# Patient Record
Sex: Female | Born: 2000 | Race: White | Hispanic: No | Marital: Single | Smoking: Never smoker
Health system: Southern US, Community
[De-identification: ages and names within clinical notes are randomized; demographics above are authoritative.]

## PROBLEM LIST (undated history)

## (undated) DIAGNOSIS — F32A Depression, unspecified: Secondary | ICD-10-CM

## (undated) DIAGNOSIS — K5090 Crohn's disease, unspecified, without complications: Secondary | ICD-10-CM

## (undated) DIAGNOSIS — L409 Psoriasis, unspecified: Secondary | ICD-10-CM

## (undated) DIAGNOSIS — O139 Gestational [pregnancy-induced] hypertension without significant proteinuria, unspecified trimester: Secondary | ICD-10-CM

## (undated) DIAGNOSIS — K5010 Crohn's disease of large intestine without complications: Secondary | ICD-10-CM

## (undated) DIAGNOSIS — F329 Major depressive disorder, single episode, unspecified: Secondary | ICD-10-CM

## (undated) HISTORY — DX: Crohn's disease of large intestine without complications: K50.10

## (undated) HISTORY — DX: Psoriasis, unspecified: L40.9

## (undated) HISTORY — PX: TYMPANOSTOMY TUBE PLACEMENT: SHX32

## (undated) HISTORY — PX: ADENOIDECTOMY: SUR15

## (undated) HISTORY — DX: Gestational (pregnancy-induced) hypertension without significant proteinuria, unspecified trimester: O13.9

## (undated) HISTORY — PX: CHOLECYSTECTOMY: SHX55

## (undated) HISTORY — PX: TONSILLECTOMY: SUR1361

---
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
## 2020-07-23 ENCOUNTER — Other Ambulatory Visit (HOSPITAL_COMMUNITY)
Admission: RE | Admit: 2020-07-23 | Discharge: 2020-07-23 | Disposition: A | Discharge status: Home / Self Care | Payer: 59 | Source: Ambulatory Visit | Attending: Obstetrics & Gynecology | Admitting: Obstetrics & Gynecology

## 2020-07-23 ENCOUNTER — Ambulatory Visit (INDEPENDENT_AMBULATORY_CARE_PROVIDER_SITE_OTHER): Payer: 59 | Admitting: Obstetrics & Gynecology

## 2020-07-23 ENCOUNTER — Other Ambulatory Visit: Payer: Self-pay

## 2020-07-23 ENCOUNTER — Encounter: Payer: Self-pay | Admitting: Obstetrics & Gynecology

## 2020-07-23 VITALS — BP 123/81 | HR 86 | Wt 234.0 lb

## 2020-07-23 DIAGNOSIS — Z3401 Encounter for supervision of normal first pregnancy, first trimester: Secondary | ICD-10-CM | POA: Diagnosis not present

## 2020-07-23 DIAGNOSIS — K5090 Crohn's disease, unspecified, without complications: Secondary | ICD-10-CM | POA: Insufficient documentation

## 2020-07-23 DIAGNOSIS — K5000 Crohn's disease of small intestine without complications: Secondary | ICD-10-CM | POA: Diagnosis not present

## 2020-07-23 DIAGNOSIS — Z3400 Encounter for supervision of normal first pregnancy, unspecified trimester: Secondary | ICD-10-CM | POA: Diagnosis present

## 2020-07-23 LAB — OB RESULTS CONSOLE GC/CHLAMYDIA: Gonorrhea: NEGATIVE

## 2020-07-23 MED ORDER — SERTRALINE HCL 25 MG PO TABS: ORAL_TABLET | ORAL | 0 refills | Status: DC

## 2020-07-23 NOTE — Addendum Note (Signed)
Addended by: Lesly Dukes on: 07/23/2020 03:03 PM   Modules accepted: Orders

## 2020-07-23 NOTE — Progress Notes (Signed)
ATING AND VIABILITY SONOGRAM   Alisson Rozell is a 20 y.o. year old G1P0 with LMP Patient's last menstrual period was 04/09/2020. which would correlate to  [redacted]w[redacted]d weeks gestation.  She has irregular menstrual cycles.   She is here today for a confirmatory initial sonogram.    GESTATION: SINGLETON Yes     FETAL ACTIVITY:          Heart rate         160          The fetus is active.          ADNEXA: The ovaries are normal.   GESTATIONAL AGE AND  BIOMETRICS:  Gestational criteria: Estimated Date of Delivery: 02/18/21 by early ultrasound now at [redacted]w[redacted]d  Previous Scans:0      CROWN RUMP LENGTH           30.40 mm         10 weeks                                                                               AVERAGE EGA(BY THIS SCAN):  10 weeks  WORKING EDD( date of conception, early ultrasound ):  02/18/21     TECHNICIAN COMMENTS:  Normal appearing single IUP with FHT of 160 BPM   A copy of this report including all images has been saved and backed up to a second source for retrieval if needed. All measures and details of the anatomical scan, placentation, fluid volume and pelvic anatomy are contained in that report.  Granville Lewis 07/23/2020 9:26 AM

## 2020-07-23 NOTE — Progress Notes (Signed)
PHQ: Score 4 GAD: Score 1

## 2020-07-23 NOTE — Progress Notes (Addendum)
Subjective:    Melissa Conway is a G1P0 [redacted]w[redacted]d being seen today for her first obstetrical visit.  Her obstetrical history is significant for obesity. Patient does intend to breast feed.  Patient conceived while taking birth control pills.  She had not missed any pills prior to several days testing positive.  She has Crohn's disease which is controlled well on Humira.  Patient also has depression and takes Wellbutrin.  Patient reports nausea.  Vitals:   07/23/20 0856  BP: 123/81  Pulse: 86  Weight: 234 lb (106.1 kg)    HISTORY: OB History  Gravida Para Term Preterm AB Living  1            SAB IAB Ectopic Multiple Live Births               # Outcome Date GA Lbr Len/2nd Weight Sex Delivery Anes PTL Lv  1 Current            Past Medical History:  Diagnosis Date   Crohn's colitis (HCC)    Depression    Past Surgical History:  Procedure Laterality Date   ADENOIDECTOMY     CHOLECYSTECTOMY     TONSILLECTOMY     TYMPANOSTOMY TUBE PLACEMENT     Family History  Problem Relation Age of Onset   Diabetes Mother    Depression Mother    OCD Mother    Depression Father    Heart disease Maternal Aunt    Cancer Maternal Uncle    Cancer Maternal Grandmother    Depression Maternal Grandmother    Anxiety disorder Maternal Grandmother    Cancer Maternal Grandfather    Diabetes Maternal Grandfather    Diabetes Paternal Grandfather      Exam    Uterus:     Pelvic Exam:    Perineum: No Hemorrhoids   Vulva: normal   Vagina:  normal mucosa, normal discharge   pH: N/a   Cervix: no cervical motion tenderness and no lesions   Adnexa: no mass, fullness, tenderness   Bony Pelvis: average  System: Breast:  normal appearance, no masses or tenderness   Skin: normal coloration and turgor, no rashes    Neurologic: oriented, normal mood   Extremities: no deformities   HEENT sclera clear, anicteric, neck supple with midline trachea, and thyroid without masses   Mouth/Teeth mucous  membranes moist, pharynx normal without lesions   Neck supple and no masses   Cardiovascular: regular rate and rhythm   Respiratory:  appears well, vitals normal, no respiratory distress, acyanotic, normal RR, chest clear, no wheezing, crepitations, rhonchi, normal symmetric air entry   Abdomen: soft, non-tender; bowel sounds normal; no masses,  no organomegaly   Urinary: urethral meatus normal      Assessment:    Pregnancy: G1P0 Patient Active Problem List   Diagnosis Date Noted   Supervision of low-risk first pregnancy 07/23/2020   Gluteal cleft wound 08/30/2015        Plan:     Initial labs drawn. Prenatal vitamins. Problem list reviewed and updated. Genetic Screening discussed:  Pts wants NIPS and AFP and Horizon  Ultrasound discussed; fetal survey: requested.    Crohn's Disease--continue [employer] (pharmacy consult with Delores.  Risk of immunosuppresion in 3rd trimester and infant can be immunosuppressed first 1-3 months of life.    Discussed transitioning from Wellbutrin to Zoloft.  We will wean her Wellbutrin to 75 mg daily for 5 days then off while starting  NIPS at 12 weeks and AFP at  15 weeks. Baby scripts schedule optimization   Melissa Conway 07/23/2020

## 2020-07-24 NOTE — Addendum Note (Signed)
Addended by: Granville Lewis on: 07/24/2020 09:42 AM   Modules accepted: Orders

## 2020-07-25 LAB — TSH: TSH: 1.8 mIU/L

## 2020-07-25 LAB — OBSTETRIC PANEL
Absolute Monocytes: 408 cells/uL (ref 200–950)
Antibody Screen: NOT DETECTED
Basophils Absolute: 32 cells/uL (ref 0–200)
Basophils Relative: 0.4 %
Eosinophils Absolute: 32 cells/uL (ref 15–500)
Eosinophils Relative: 0.4 %
HCT: 38.9 % (ref 35.0–45.0)
Hemoglobin: 13.1 g/dL (ref 11.7–15.5)
Hepatitis B Surface Ag: NONREACTIVE
Lymphs Abs: 1696 cells/uL (ref 850–3900)
MCH: 29.6 pg (ref 27.0–33.0)
MCHC: 33.7 g/dL (ref 32.0–36.0)
MCV: 87.8 fL (ref 80.0–100.0)
MPV: 12.4 fL (ref 7.5–12.5)
Monocytes Relative: 5.1 %
Neutro Abs: 5832 cells/uL (ref 1500–7800)
Neutrophils Relative %: 72.9 %
Platelets: 254 10*3/uL (ref 140–400)
RBC: 4.43 10*6/uL (ref 3.80–5.10)
RDW: 14.5 % (ref 11.0–15.0)
RPR Ser Ql: NONREACTIVE
Rubella: 3.87 Index
Total Lymphocyte: 21.2 %
WBC: 8 10*3/uL (ref 3.8–10.8)

## 2020-07-25 LAB — HEMOGLOBINOPATHY EVALUATION
Fetal Hemoglobin Testing: <1 % (ref 0.0–1.9)
HCT: 39.6 % (ref 35.0–45.0)
Hemoglobin A2 - HGBRFX: 2.5 % (ref 2.2–3.2)
Hemoglobin: 13.3 g/dL (ref 11.7–15.5)
Hgb A: 97.5 % (ref >96.0)
MCH: 29.6 pg (ref 27.0–33.0)
MCV: 88.2 fL (ref 80.0–100.0)
RBC: 4.49 10*6/uL (ref 3.80–5.10)
RDW: 14.7 % (ref 11.0–15.0)

## 2020-07-25 LAB — URINE CULTURE
MICRO NUMBER:: 12104131
SPECIMEN QUALITY:: ADEQUATE

## 2020-07-25 LAB — HEPATITIS C ANTIBODY
Hepatitis C Ab: NONREACTIVE
SIGNAL TO CUT-OFF: 0.09 (ref <1.00)

## 2020-07-25 LAB — HIV ANTIBODY (ROUTINE TESTING W REFLEX): HIV 1&2 Ab, 4th Generation: NONREACTIVE

## 2020-07-27 LAB — CYTOLOGY - PAP
Chlamydia: NEGATIVE
Comment: NEGATIVE
Comment: NORMAL
Diagnosis: NEGATIVE
Neisseria Gonorrhea: NEGATIVE

## 2020-08-06 ENCOUNTER — Other Ambulatory Visit: Payer: Self-pay

## 2020-08-06 ENCOUNTER — Ambulatory Visit (INDEPENDENT_AMBULATORY_CARE_PROVIDER_SITE_OTHER): Payer: 59 | Admitting: *Deleted

## 2020-08-06 DIAGNOSIS — Z3490 Encounter for supervision of normal pregnancy, unspecified, unspecified trimester: Secondary | ICD-10-CM

## 2020-08-06 DIAGNOSIS — Z36 Encounter for antenatal screening for chromosomal anomalies: Secondary | ICD-10-CM

## 2020-08-06 NOTE — Progress Notes (Signed)
Pt here for NIPTS only. °

## 2020-08-20 ENCOUNTER — Encounter: Payer: Self-pay | Admitting: *Deleted

## 2020-08-20 DIAGNOSIS — Z3400 Encounter for supervision of normal first pregnancy, unspecified trimester: Secondary | ICD-10-CM

## 2020-08-20 NOTE — Progress Notes (Signed)
Horizons scanned into chart and routed to Dr Penne Lash.

## 2020-08-21 ENCOUNTER — Encounter: Payer: Self-pay | Admitting: *Deleted

## 2020-08-21 DIAGNOSIS — Z3400 Encounter for supervision of normal first pregnancy, unspecified trimester: Secondary | ICD-10-CM

## 2020-08-30 ENCOUNTER — Encounter: Payer: 59 | Admitting: Obstetrics and Gynecology

## 2020-09-06 ENCOUNTER — Ambulatory Visit (INDEPENDENT_AMBULATORY_CARE_PROVIDER_SITE_OTHER): Payer: 59 | Admitting: Obstetrics and Gynecology

## 2020-09-06 ENCOUNTER — Other Ambulatory Visit: Payer: Self-pay

## 2020-09-06 ENCOUNTER — Encounter: Payer: Self-pay | Admitting: Obstetrics and Gynecology

## 2020-09-06 VITALS — BP 133/80 | HR 81 | Wt 230.0 lb

## 2020-09-06 DIAGNOSIS — Z3400 Encounter for supervision of normal first pregnancy, unspecified trimester: Secondary | ICD-10-CM

## 2020-09-06 DIAGNOSIS — L409 Psoriasis, unspecified: Secondary | ICD-10-CM | POA: Insufficient documentation

## 2020-09-06 DIAGNOSIS — F32A Depression, unspecified: Secondary | ICD-10-CM | POA: Insufficient documentation

## 2020-09-06 DIAGNOSIS — F419 Anxiety disorder, unspecified: Secondary | ICD-10-CM

## 2020-09-06 DIAGNOSIS — K5000 Crohn's disease of small intestine without complications: Secondary | ICD-10-CM

## 2020-09-06 NOTE — Progress Notes (Signed)
   PRENATAL VISIT NOTE  Subjective:  Melissa Conway is a 20 y.o. G1P0 at [redacted]w[redacted]d being seen today for ongoing prenatal care.  She is currently monitored for the following issues for this low-risk pregnancy and has Supervision of low-risk first pregnancy; Crohn disease (HCC); Psoriasis; and Anxiety and depression on their problem list.  Patient reports no complaints.  Contractions: Not present. Vag. Bleeding: None.  Movement: Absent. Denies leaking of fluid.   The following portions of the patient's history were reviewed and updated as appropriate: allergies, current medications, past family history, past medical history, past social history, past surgical history and problem list.   Objective:   Vitals:   09/06/20 1022  BP: 133/80  Pulse: 81  Weight: 230 lb (104.3 kg)    Fetal Status: Fetal Heart Rate (bpm): 143   Movement: Absent     General:  Alert, oriented and cooperative. Patient is in no acute distress.  Skin: Skin is warm and dry. No rash noted.   Cardiovascular: Normal heart rate noted  Respiratory: Normal respiratory effort, no problems with respiration noted  Abdomen: Soft, gravid, appropriate for gestational age.  Pain/Pressure: Absent     Pelvic: Cervical exam deferred        Extremities: Normal range of motion.  Edema: None  Mental Status: Normal mood and affect. Normal behavior. Normal judgment and thought content.   Assessment and Plan:  Pregnancy: G1P0 at [redacted]w[redacted]d 1. Encounter for supervision of low-risk first pregnancy, antepartum - She may be interested in water birth. Discussed would need classes and meet with CNMs. Will have her do next appt with them.  - Horizon and NIPS negative - She is on babyscripts  - Alpha fetoprotein, maternal - [place] MFM OB COMP + 14 WK; Future  2. Anxiety and depression - Well controlled at this time on Zoloft   3. Crohn's disease of small intestine without complication (HCC) - In remission on Humaira  Preterm labor symptoms and  general obstetric precautions including but not limited to vaginal bleeding, contractions, leaking of fluid and fetal movement were reviewed in detail with the patient. Please refer to After Visit Summary for other counseling recommendations.   Return in about 4 weeks (around 10/04/2020), or with CNM please.  No future appointments.  Milas Hock, MD

## 2020-09-07 LAB — ALPHA FETOPROTEIN, MATERNAL
AFP MoM: 1.06
AFP, Serum: 29 ng/mL
Calc'd Gestational Age: 16.4 weeks
Maternal Wt: 230 [lb_av]
Risk for ONTD: <1
Twins-AFP: 1

## 2020-09-27 ENCOUNTER — Ambulatory Visit: Payer: 59 | Attending: Obstetrics and Gynecology

## 2020-09-27 ENCOUNTER — Other Ambulatory Visit: Payer: Self-pay | Admitting: Obstetrics and Gynecology

## 2020-09-27 ENCOUNTER — Other Ambulatory Visit: Payer: Self-pay

## 2020-09-27 ENCOUNTER — Other Ambulatory Visit: Payer: Self-pay | Admitting: *Deleted

## 2020-09-27 DIAGNOSIS — O99212 Obesity complicating pregnancy, second trimester: Secondary | ICD-10-CM | POA: Diagnosis not present

## 2020-09-27 DIAGNOSIS — Z3689 Encounter for other specified antenatal screening: Secondary | ICD-10-CM

## 2020-09-27 DIAGNOSIS — Z3400 Encounter for supervision of normal first pregnancy, unspecified trimester: Secondary | ICD-10-CM | POA: Insufficient documentation

## 2020-09-27 DIAGNOSIS — E669 Obesity, unspecified: Secondary | ICD-10-CM

## 2020-09-27 DIAGNOSIS — Z362 Encounter for other antenatal screening follow-up: Secondary | ICD-10-CM

## 2020-09-27 DIAGNOSIS — Z3A19 19 weeks gestation of pregnancy: Secondary | ICD-10-CM

## 2020-10-02 ENCOUNTER — Other Ambulatory Visit: Payer: Self-pay

## 2020-10-02 ENCOUNTER — Ambulatory Visit (INDEPENDENT_AMBULATORY_CARE_PROVIDER_SITE_OTHER): Payer: 59

## 2020-10-02 VITALS — BP 124/75 | HR 85 | Wt 236.0 lb

## 2020-10-02 DIAGNOSIS — Z3400 Encounter for supervision of normal first pregnancy, unspecified trimester: Secondary | ICD-10-CM

## 2020-10-02 DIAGNOSIS — Z23 Encounter for immunization: Secondary | ICD-10-CM | POA: Diagnosis not present

## 2020-10-02 DIAGNOSIS — Z3A20 20 weeks gestation of pregnancy: Secondary | ICD-10-CM

## 2020-10-02 DIAGNOSIS — K5900 Constipation, unspecified: Secondary | ICD-10-CM

## 2020-10-02 DIAGNOSIS — O99612 Diseases of the digestive system complicating pregnancy, second trimester: Secondary | ICD-10-CM

## 2020-10-02 NOTE — Progress Notes (Signed)
   PRENATAL VISIT NOTE  Subjective:  Melissa Conway is a 20 y.o. G1P0 at [redacted]w[redacted]d being seen today for ongoing prenatal care.  She is currently monitored for the following issues for this low-risk pregnancy and has Supervision of low-risk first pregnancy; Crohn disease (HCC); Psoriasis; and Anxiety and depression on their problem list.  Patient reports  constipation . She reports issues with constipation throughout her life due to Crohn's but feels like it has been significantly worse in pregnancy. She reports using an enema once a week currently.  Contractions: Not present. Vag. Bleeding: None.  Movement: Present. Denies leaking of fluid.   The following portions of the patient's history were reviewed and updated as appropriate: allergies, current medications, past family history, past medical history, past social history, past surgical history and problem list.   Objective:   Vitals:   10/02/20 0904  BP: 124/75  Pulse: 85  Weight: 236 lb (107 kg)    Fetal Status: Fetal Heart Rate (bpm): 149 Fundal Height: 20 cm Movement: Present     General:  Alert, oriented and cooperative. Patient is in no acute distress.  Skin: Skin is warm and dry. No rash noted.   Cardiovascular: Normal heart rate noted  Respiratory: Normal respiratory effort, no problems with respiration noted  Abdomen: Soft, gravid, appropriate for gestational age.  Pain/Pressure: Absent     Pelvic: Cervical exam deferred        Extremities: Normal range of motion.  Edema: None  Mental Status: Normal mood and affect. Normal behavior. Normal judgment and thought content.   Assessment and Plan:  Pregnancy: G1P0 at [redacted]w[redacted]d 1. Encounter for supervision of low-risk first pregnancy, antepartum -Constipation relief options reviewed. List of safe medications and poop clean out reviewed.  -Patient is scheduled to see GI on 10/6 to discuss management as well  -Pregnancy without issues so far.  -Desires waterbirth. Discussed  requirements and things that would risk her out. Discussed that waterbirth can be an option when CNM is on labor and delivery but cannot always guarantee due to nature of C[facility]  2. [redacted] weeks gestation of pregnancy -Incomplete anatomy, scheduled for follow up in 4 weeks on 10/13  Preterm labor symptoms and general obstetric precautions including but not limited to vaginal bleeding, contractions, leaking of fluid and fetal movement were reviewed in detail with the patient. Please refer to After Visit Summary for other counseling recommendations.   Return in about 4 weeks (around 10/30/2020) for Return OB visit.  Future Appointments  Date Time Provider Department Center  10/25/2020  3:30 PM [facility] NURSE [facility]  10/25/2020  3:45 PM [facility]  11/02/2020  8:10 AM Donette Larry, CNM [facility]    Rolm Bookbinder, CNM 10/02/20 9:44 AM

## 2020-10-02 NOTE — Patient Instructions (Signed)
Safe Medications in Pregnancy   Acne: Benzoyl Peroxide Salicylic Acid  Backache/Headache: Tylenol: 2 regular strength every 4 hours OR              2 Extra strength every 6 hours  Colds/Coughs/Allergies: Benadryl (alcohol free) 25 mg every 6 hours as needed Breath right strips Claritin Cepacol throat lozenges Chloraseptic throat spray Cold-Eeze- up to three times per day Cough drops, alcohol free Flonase (by prescription only) Guaifenesin Mucinex Robitussin DM (plain only, alcohol free) Saline nasal spray/drops Sudafed (pseudoephedrine) & Actifed ** use only after [redacted] weeks gestation and if you do not have high blood pressure Tylenol Vicks Vaporub Zinc lozenges Zyrtec   Constipation: Colace Ducolax suppositories Fleet enema Glycerin suppositories Metamucil Milk of magnesia Miralax Senokot Smooth move tea  Diarrhea: Kaopectate Imodium A-D  *NO pepto Bismol  Hemorrhoids: Anusol Anusol HC Preparation H Tucks  Indigestion: Tums Maalox Mylanta Zantac  Pepcid  Insomnia: Benadryl (alcohol free) 25mg every 6 hours as needed Tylenol PM Unisom, no Gelcaps  Leg Cramps: Tums MagGel  Nausea/Vomiting:  Bonine Dramamine Emetrol Ginger extract Sea bands Meclizine  Nausea medication to take during pregnancy:  Unisom (doxylamine succinate 25 mg tablets) Take one tablet daily at bedtime. If symptoms are not adequately controlled, the dose can be increased to a maximum recommended dose of two tablets daily (1/2 tablet in the morning, 1/2 tablet mid-afternoon and one at bedtime). Vitamin B6 100mg tablets. Take one tablet twice a day (up to 200 mg per day).  Skin Rashes: Aveeno products Benadryl cream or 25mg every 6 hours as needed Calamine Lotion 1% cortisone cream  Yeast infection: Gyne-lotrimin 7 Monistat 7   **If taking multiple medications, please check labels to avoid duplicating the same active ingredients **take medication as directed on  the label ** Do not exceed 4000 mg of tylenol in 24 hours **Do not take medications that contain aspirin or ibuprofen    Constipation/Bowel Prep  You have constipation which is hard stools that are difficult to pass. It is important to have regular bowel movements every 1-3 days that are soft and easy to pass. Hard stools increase your risk of hemorrhoids and are very uncomfortable.   To prevent constipation you can increase the amount of fiber in your diet. Examples of foods with fiber are leafy greens, whole grain breads, oatmeal and other grains.  It is also important to drink at least eight 8oz glass of water everyday.   If you have not has a bowel movement in 4-5 days you made need to clean out your bowel.  This will have establish normal movement through your bowel.    Miralax Clean out  Take 8 capfuls of miralax in 64 oz of gatorade. You can use any fluid that appeals to you (gatorade, water, juice)  Continue to drink at least eight 8 oz glasses of water throughout the day  You can repeat with another 8 capfuls of miralax in 64 oz of gatorade if you are not having a large amount of stools  You will need to be at home and close to a bathroom for about 8 hours when you do the above as you may need to go to the bathroom frequently.   After you are cleaned out: - Start Colace100mg twice daily - Start Miralax once daily - Start a daily fiber supplement like metamucil or citrucel - You can safely use enemas in pregnancy  - if you are having diarrhea you can reduce to Colace once   a day or miralax every other day or a 1/2 capful daily.    

## 2020-10-02 NOTE — Progress Notes (Signed)
Pt c/o constipation- sees GI doc on 10/6

## 2020-10-25 ENCOUNTER — Ambulatory Visit: Payer: 59 | Attending: Obstetrics

## 2020-10-25 ENCOUNTER — Ambulatory Visit: Payer: 59 | Admitting: *Deleted

## 2020-10-25 ENCOUNTER — Other Ambulatory Visit: Payer: Self-pay

## 2020-10-25 ENCOUNTER — Encounter: Payer: Self-pay | Admitting: *Deleted

## 2020-10-25 VITALS — BP 130/68 | HR 93

## 2020-10-25 DIAGNOSIS — Z3A23 23 weeks gestation of pregnancy: Secondary | ICD-10-CM

## 2020-10-25 DIAGNOSIS — Z362 Encounter for other antenatal screening follow-up: Secondary | ICD-10-CM | POA: Diagnosis not present

## 2020-10-25 DIAGNOSIS — Z3400 Encounter for supervision of normal first pregnancy, unspecified trimester: Secondary | ICD-10-CM | POA: Insufficient documentation

## 2020-10-25 DIAGNOSIS — E669 Obesity, unspecified: Secondary | ICD-10-CM | POA: Diagnosis not present

## 2020-10-25 DIAGNOSIS — O99212 Obesity complicating pregnancy, second trimester: Secondary | ICD-10-CM

## 2020-10-25 DIAGNOSIS — O99612 Diseases of the digestive system complicating pregnancy, second trimester: Secondary | ICD-10-CM

## 2020-10-26 ENCOUNTER — Other Ambulatory Visit: Payer: Self-pay | Admitting: *Deleted

## 2020-10-26 DIAGNOSIS — Z6837 Body mass index (BMI) 37.0-37.9, adult: Secondary | ICD-10-CM

## 2020-10-26 DIAGNOSIS — K5090 Crohn's disease, unspecified, without complications: Secondary | ICD-10-CM

## 2020-11-02 ENCOUNTER — Other Ambulatory Visit: Payer: Self-pay

## 2020-11-02 ENCOUNTER — Ambulatory Visit (INDEPENDENT_AMBULATORY_CARE_PROVIDER_SITE_OTHER): Payer: 59 | Admitting: Certified Nurse Midwife

## 2020-11-02 VITALS — BP 129/83 | HR 89 | Wt 244.0 lb

## 2020-11-02 DIAGNOSIS — Z3402 Encounter for supervision of normal first pregnancy, second trimester: Secondary | ICD-10-CM

## 2020-11-02 DIAGNOSIS — Z3A24 24 weeks gestation of pregnancy: Secondary | ICD-10-CM

## 2020-11-02 DIAGNOSIS — O99210 Obesity complicating pregnancy, unspecified trimester: Secondary | ICD-10-CM | POA: Insufficient documentation

## 2020-11-02 NOTE — Progress Notes (Addendum)
Subjective:  Loretto Belinsky is a 20 y.o. G1P0 at [redacted]w[redacted]d being seen today for ongoing prenatal care.  She is currently monitored for the following issues for this low-risk pregnancy and has Supervision of low-risk first pregnancy; Crohn disease (HCC); Psoriasis; Anxiety and depression; and Obesity in pregnancy on their problem list.  Patient reports no complaints.  Contractions: Not present. Vag. Bleeding: None.  Movement: Present. Denies leaking of fluid.   The following portions of the patient's history were reviewed and updated as appropriate: allergies, current medications, past family history, past medical history, past social history, past surgical history and problem list. Problem list updated.  Objective:   Vitals:   11/02/20 0811  BP: 129/83  Pulse: 89  Weight: 244 lb (110.7 kg)    Fetal Status: Fetal Heart Rate (bpm): 138 Fundal Height: 27 cm Movement: Present     General:  Alert, oriented and cooperative. Patient is in no acute distress.  Skin: Skin is warm and dry. No rash noted.   Cardiovascular: Normal heart rate noted  Respiratory: Normal respiratory effort, no problems with respiration noted  Abdomen: Soft, gravid, appropriate for gestational age. Pain/Pressure: Absent     Pelvic: Vag. Bleeding: None Vag D/C Character: Thin   Cervical exam deferred        Extremities: Normal range of motion.  Edema: None  Mental Status: Normal mood and affect. Normal behavior. Normal judgment and thought content.   Urinalysis:      Assessment and Plan:  Pregnancy: G1P0 at [redacted]w[redacted]d  1. Encounter for supervision of low-risk first pregnancy in second trimester - considering waterbirth: recommend class and remaining visits with CNM  2. Obesity in pregnancy - nml growth, [place] scheduled next month  3. [redacted] weeks gestation  Preterm labor symptoms and general obstetric precautions including but not limited to vaginal bleeding, contractions, leaking of fluid and fetal movement were reviewed in  detail with the patient. Please refer to After Visit Summary for other counseling recommendations.  Return in about 4 weeks (around 11/30/2020).   Donette Larry, CNM

## 2020-11-22 ENCOUNTER — Ambulatory Visit: Payer: 59 | Admitting: *Deleted

## 2020-11-22 ENCOUNTER — Encounter: Payer: Self-pay | Admitting: *Deleted

## 2020-11-22 ENCOUNTER — Other Ambulatory Visit: Payer: Self-pay | Admitting: *Deleted

## 2020-11-22 ENCOUNTER — Other Ambulatory Visit: Payer: Self-pay

## 2020-11-22 ENCOUNTER — Ambulatory Visit: Payer: 59 | Attending: Obstetrics

## 2020-11-22 VITALS — BP 126/71 | HR 81

## 2020-11-22 DIAGNOSIS — E669 Obesity, unspecified: Secondary | ICD-10-CM

## 2020-11-22 DIAGNOSIS — O99212 Obesity complicating pregnancy, second trimester: Secondary | ICD-10-CM

## 2020-11-22 DIAGNOSIS — Z3A27 27 weeks gestation of pregnancy: Secondary | ICD-10-CM

## 2020-11-22 DIAGNOSIS — Z3402 Encounter for supervision of normal first pregnancy, second trimester: Secondary | ICD-10-CM | POA: Diagnosis present

## 2020-11-22 DIAGNOSIS — O99210 Obesity complicating pregnancy, unspecified trimester: Secondary | ICD-10-CM | POA: Insufficient documentation

## 2020-11-22 DIAGNOSIS — K5090 Crohn's disease, unspecified, without complications: Secondary | ICD-10-CM

## 2020-11-22 DIAGNOSIS — Z6837 Body mass index (BMI) 37.0-37.9, adult: Secondary | ICD-10-CM | POA: Insufficient documentation

## 2020-11-22 DIAGNOSIS — O99612 Diseases of the digestive system complicating pregnancy, second trimester: Secondary | ICD-10-CM

## 2020-11-30 ENCOUNTER — Ambulatory Visit (INDEPENDENT_AMBULATORY_CARE_PROVIDER_SITE_OTHER): Payer: 59 | Admitting: Certified Nurse Midwife

## 2020-11-30 ENCOUNTER — Other Ambulatory Visit: Payer: Self-pay

## 2020-11-30 VITALS — BP 132/76 | HR 96 | Wt 248.0 lb

## 2020-11-30 DIAGNOSIS — Z23 Encounter for immunization: Secondary | ICD-10-CM

## 2020-11-30 DIAGNOSIS — Z3402 Encounter for supervision of normal first pregnancy, second trimester: Secondary | ICD-10-CM | POA: Diagnosis not present

## 2020-11-30 DIAGNOSIS — F419 Anxiety disorder, unspecified: Secondary | ICD-10-CM

## 2020-11-30 DIAGNOSIS — O99210 Obesity complicating pregnancy, unspecified trimester: Secondary | ICD-10-CM

## 2020-11-30 DIAGNOSIS — Z3A28 28 weeks gestation of pregnancy: Secondary | ICD-10-CM

## 2020-11-30 DIAGNOSIS — F32A Depression, unspecified: Secondary | ICD-10-CM

## 2020-11-30 NOTE — Progress Notes (Signed)
Subjective:  Melissa Conway is a 20 y.o. G1P0 at [redacted]w[redacted]d being seen today for ongoing prenatal care.  She is currently monitored for the following issues for this low-risk pregnancy and has Supervision of low-risk first pregnancy; Crohn disease (HCC); Psoriasis; Anxiety and depression; and Obesity in pregnancy on their problem list.  Patient reports no complaints.  Contractions: Not present. Vag. Bleeding: None.  Movement: Present. Denies leaking of fluid.   The following portions of the patient's history were reviewed and updated as appropriate: allergies, current medications, past family history, past medical history, past social history, past surgical history and problem list. Problem list updated.  Objective:   Vitals:   11/30/20 0828  BP: 132/76  Pulse: 96  Weight: 248 lb (112.5 kg)    Fetal Status: Fetal Heart Rate (bpm): 131 Fundal Height: 29 cm Movement: Present     General:  Alert, oriented and cooperative. Patient is in no acute distress.  Skin: Skin is warm and dry. No rash noted.   Cardiovascular: Normal heart rate noted  Respiratory: Normal respiratory effort, no problems with respiration noted  Abdomen: Soft, gravid, appropriate for gestational age. Pain/Pressure: Absent     Pelvic: Vag. Bleeding: None Vag D/C Character: Thin   Cervical exam deferred        Extremities: Normal range of motion.  Edema: None  Mental Status: Normal mood and affect. Normal behavior. Normal judgment and thought content.   Urinalysis:      Assessment and Plan:  Pregnancy: G1P0 at [redacted]w[redacted]d  1. Encounter for supervision of low-risk first pregnancy in second trimester  - 2Hr GTT w/ 1 Hr Carpenter 75 g - HIV antibody (with reflex) - CBC - RPR - Tdap vaccine greater than or equal to 7yo IM  2. Obesity in pregnancy - 59 %ile on last scan, f/u US for growth  3. Anxiety and depression -stable on Zoloft  4. [redacted] weeks gestation of pregnancy - GTT today  Preterm labor symptoms and general  obstetric precautions including but not limited to vaginal bleeding, contractions, leaking of fluid and fetal movement were reviewed in detail with the patient. Please refer to After Visit Summary for other counseling recommendations.  Return in about 2 weeks (around 12/14/2020).   Donette Larry, CNM

## 2020-12-02 ENCOUNTER — Encounter: Payer: Self-pay | Admitting: Certified Nurse Midwife

## 2020-12-02 DIAGNOSIS — O99019 Anemia complicating pregnancy, unspecified trimester: Secondary | ICD-10-CM | POA: Insufficient documentation

## 2020-12-03 LAB — CBC
HCT: 31.4 % — ABNORMAL LOW (ref 35.0–45.0)
Hemoglobin: 10.4 g/dL — ABNORMAL LOW (ref 11.7–15.5)
MCH: 31.1 pg (ref 27.0–33.0)
MCHC: 33.1 g/dL (ref 32.0–36.0)
MCV: 94 fL (ref 80.0–100.0)
MPV: 12.1 fL (ref 7.5–12.5)
Platelets: 198 10*3/uL (ref 140–400)
RBC: 3.34 10*6/uL — ABNORMAL LOW (ref 3.80–5.10)
RDW: 12.8 % (ref 11.0–15.0)
WBC: 8.5 10*3/uL (ref 3.8–10.8)

## 2020-12-03 LAB — 2HR GTT W 1 HR, CARPENTER, 75 G
Glucose, 1 Hr, Gest: 148 mg/dL (ref 65–179)
Glucose, 2 Hr, Gest: 116 mg/dL (ref 65–152)
Glucose, Fasting, Gest: 84 mg/dL (ref 65–91)

## 2020-12-03 LAB — RPR: RPR Ser Ql: NONREACTIVE

## 2020-12-03 LAB — HIV ANTIBODY (ROUTINE TESTING W REFLEX): HIV 1&2 Ab, 4th Generation: NONREACTIVE

## 2020-12-14 ENCOUNTER — Telehealth (INDEPENDENT_AMBULATORY_CARE_PROVIDER_SITE_OTHER): Payer: 59 | Admitting: Certified Nurse Midwife

## 2020-12-14 VITALS — BP 113/75 | HR 95 | Wt 247.0 lb

## 2020-12-14 DIAGNOSIS — Z3A30 30 weeks gestation of pregnancy: Secondary | ICD-10-CM

## 2020-12-14 DIAGNOSIS — O99213 Obesity complicating pregnancy, third trimester: Secondary | ICD-10-CM

## 2020-12-14 DIAGNOSIS — Z3402 Encounter for supervision of normal first pregnancy, second trimester: Secondary | ICD-10-CM

## 2020-12-14 DIAGNOSIS — E669 Obesity, unspecified: Secondary | ICD-10-CM

## 2020-12-14 DIAGNOSIS — O99210 Obesity complicating pregnancy, unspecified trimester: Secondary | ICD-10-CM

## 2020-12-14 DIAGNOSIS — D649 Anemia, unspecified: Secondary | ICD-10-CM

## 2020-12-14 DIAGNOSIS — O99013 Anemia complicating pregnancy, third trimester: Secondary | ICD-10-CM

## 2020-12-14 NOTE — Progress Notes (Signed)
[facility]  OBSTETRICS PRENATAL VIRTUAL VISIT ENCOUNTER NOTE  Provider location: [facility] [place]   Patient location: Home  I connected with Melissa Conway on 12/14/20 at  8:10 AM EST by MyChart Video Encounter and verified that I am speaking with the correct person using two identifiers. I discussed the limitations, risks, security and privacy concerns of performing an evaluation and management service virtually and the availability of in person appointments. I also discussed with the patient that there may be a patient responsible charge related to this service. The patient expressed understanding and agreed to proceed. Subjective:  Melissa Conway is a 20 y.o. G1P0 at [redacted]w[redacted]d being seen today for ongoing prenatal care.  She is currently monitored for the following issues for this low-risk pregnancy and has Supervision of low-risk first pregnancy; Crohn disease ([place]); Psoriasis; Anxiety and depression; Obesity in pregnancy; and Anemia in pregnancy on their problem list.  Patient reports no complaints.  Contractions: Not present. Vag. Bleeding: None.  Movement: Present. Denies any leaking of fluid.   The following portions of the patient's history were reviewed and updated as appropriate: allergies, current medications, past family history, past medical history, past social history, past surgical history and problem list.   Objective:   Vitals:   12/14/20 0814  BP: 113/75  Pulse: 95  Weight: 247 lb (112 kg)    Fetal Status:     Movement: Present     General:  Alert, oriented and cooperative. Patient is in no acute distress.  Respiratory: Normal respiratory effort, no problems with respiration noted  Mental Status: Normal mood and affect. Normal behavior. Normal judgment and thought content.  Rest of physical exam deferred due to type of encounter  Imaging: [place] MFM OB FOLLOW UP  Result Date:  11/22/2020 ----------------------------------------------------------------------  OBSTETRICS REPORT                       (Signed Final 11/22/2020 10:31 am) ---------------------------------------------------------------------- Patient Info  ID #:       [id number]                          D.O.B.:  2000/02/27 (20 yrs)  Name:       Melissa Conway                 Visit Date: 11/22/2020 07:31 am ---------------------------------------------------------------------- Performed By  Attending:        Tama High MD        Referred By:      Radene Gunning  Performed By:     Lelan Pons RDMS       [facility]                                                             [place] [facility] ---------------------------------------------------------------------- Orders  #  Description                           Code        Ordered By  1  [place] MFM OB FOLLOW UP                   [phone]    Peterson Ao ----------------------------------------------------------------------  #  Order #                     Accession #                Episode #  1  [id number]                   [id number]                 [id number] ---------------------------------------------------------------------- Indications  [redacted] weeks gestation of pregnancy                Z3A.27  Maternal Crohn's disease affecting             O99.612  pregnancy in second trimester  Obesity complicating pregnancy, second         O99.212  trimester ---------------------------------------------------------------------- Fetal Evaluation  Num Of Fetuses:         1  Fetal Heart Rate(bpm):  130  Cardiac Activity:       Observed  Presentation:           Cephalic  Placenta:               Posterior  P. Cord Insertion:      Previously Visualized  Amniotic Fluid  AFI FV:      Within normal limits                              Largest Pocket(cm)                               5.1 ---------------------------------------------------------------------- Biometry  BPD:        71  mm     G. Age:  28w 4d         74  %    CI:         72.6   %    70 - 86                                                          FL/HC:      20.0   %    18.6 - 20.4  HC:       265   mm     G. Age:  28w 6d         67  %    HC/AC:      1.14        1.05 - 1.21  AC:      231.8  mm     G. Age:  27w 4d         45  %    FL/BPD:     74.8   %    71 -  87  FL:       53.1  mm     G. Age:  28w 1d         59  %    FL/AC:      22.9   %    20 - 24  LV:        4.3  mm  Est. FW:    1151  gm      2 lb 9 oz     59  % ---------------------------------------------------------------------- Gestational Age  LMP:           32w 3d        Date:  04/09/20                 EDD:   01/14/21  U/S Today:     28w 2d                                        EDD:   02/12/21  Best:          27w 3d     Det. ByMarcella Dubs         EDD:   02/18/21                                      (07/23/20) ---------------------------------------------------------------------- Anatomy  Cranium:               Appears normal         Aortic Arch:            Previously seen  Cavum:                 Previously seen        Ductal Arch:            Previously seen  Ventricles:            Appears normal         Diaphragm:              Previously seen  Choroid Plexus:        Previously seen        Stomach:                Appears normal, left                                                                        sided  Cerebellum:            Previously seen        Abdomen:                Previously seen  Posterior Fossa:       Previously seen        Abdominal Wall:         Previously seen  Nuchal Fold:           Previously seen        Cord Vessels:           Previously seen  Face:                  Orbits and profile     Kidneys:                Appear normal                         previously seen  Lips:                  Previously seen        Bladder:                 Appears normal  Thoracic:              Previously seen        Spine:                  Appears normal  Heart:                 Appears normal         Upper Extremities:      Previously seen                         (4CH, axis, and                         situs)  RVOT:                  Previously seen        Lower Extremities:      Previously seen  LVOT:                  Previously seen  Other:  Female gender previously seen. VC appears normal, 3VV and 3VTV          previously visualized. ---------------------------------------------------------------------- Cervix Uterus Adnexa  Cervix  Not visualized (advanced GA >24wks)  Right Ovary  Visualized.  Left Ovary  Visualized. ---------------------------------------------------------------------- Impression  Maternal Crohn's disease.  Patient takes Humira every 2  weeks.  She has not had flares in this pregnancy.  Fetal growth is appropriate for gestational age .Amniotic fluid  is normal and good fetal activity is seen .  We reassured the patient of the findings. ---------------------------------------------------------------------- Recommendations  -An appointment was made for her to return in 5 weeks for  fetal growth assessment. ----------------------------------------------------------------------                  Tama High, MD Electronically Signed Final Report   11/22/2020 10:31 am ----------------------------------------------------------------------   Assessment and Plan:  Pregnancy: G1P0 at [redacted]w[redacted]d 1. Obesity in pregnancy - growth [place] in 2 weeks  2. Encounter for supervision of low-risk first pregnancy in third trimester  3. Anemia during pregnancy in third trimester - started Fe, tolerating well - encouraged high Fe foods  4. [redacted] weeks gestation of pregnancy   Preterm labor symptoms and general obstetric precautions including but not limited to vaginal bleeding, contractions, leaking of fluid and fetal movement were reviewed in detail  with the patient. I discussed the assessment and treatment plan with the patient. The patient was provided an opportunity to ask questions and all were answered. The patient agreed with the plan and demonstrated an understanding of the instructions. The patient was advised to call back or seek an in-person office evaluation/go to MAU at [place] for any  urgent or concerning symptoms. Please refer to After Visit Summary for other counseling recommendations.   I provided 5 minutes of face-to-face time during this encounter.  Return in about 2 weeks (around 12/28/2020).  Future Appointments  Date Time Provider [place]  12/27/2020  7:45 AM [facility] NURSE [facility]  12/27/2020  8:00 AM [facility] US1 [facility] [place], [place] for [employer], [place]

## 2020-12-15 ENCOUNTER — Encounter (HOSPITAL_BASED_OUTPATIENT_CLINIC_OR_DEPARTMENT_OTHER): Payer: Self-pay

## 2020-12-15 ENCOUNTER — Emergency Department (HOSPITAL_BASED_OUTPATIENT_CLINIC_OR_DEPARTMENT_OTHER): Payer: 59 | Admitting: Radiology

## 2020-12-15 ENCOUNTER — Emergency Department (HOSPITAL_BASED_OUTPATIENT_CLINIC_OR_DEPARTMENT_OTHER)
Admission: EM | Admit: 2020-12-15 | Discharge: 2020-12-15 | Disposition: A | Discharge status: Home / Self Care | Payer: 59 | Attending: Emergency Medicine | Admitting: Emergency Medicine

## 2020-12-15 ENCOUNTER — Other Ambulatory Visit: Payer: Self-pay

## 2020-12-15 DIAGNOSIS — Z20822 Contact with and (suspected) exposure to covid-19: Secondary | ICD-10-CM | POA: Diagnosis not present

## 2020-12-15 DIAGNOSIS — R233 Spontaneous ecchymoses: Secondary | ICD-10-CM | POA: Diagnosis not present

## 2020-12-15 DIAGNOSIS — O26893 Other specified pregnancy related conditions, third trimester: Secondary | ICD-10-CM | POA: Diagnosis present

## 2020-12-15 DIAGNOSIS — O98513 Other viral diseases complicating pregnancy, third trimester: Secondary | ICD-10-CM | POA: Insufficient documentation

## 2020-12-15 DIAGNOSIS — Z3A30 30 weeks gestation of pregnancy: Secondary | ICD-10-CM

## 2020-12-15 DIAGNOSIS — R000 Tachycardia, unspecified: Secondary | ICD-10-CM | POA: Insufficient documentation

## 2020-12-15 DIAGNOSIS — J101 Influenza due to other identified influenza virus with other respiratory manifestations: Secondary | ICD-10-CM

## 2020-12-15 LAB — CBC
HCT: 30.4 % — ABNORMAL LOW (ref 36.0–46.0)
Hemoglobin: 10.3 g/dL — ABNORMAL LOW (ref 12.0–15.0)
MCH: 31.3 pg (ref 26.0–34.0)
MCHC: 33.9 g/dL (ref 30.0–36.0)
MCV: 92.4 fL (ref 80.0–100.0)
Platelets: 182 10*3/uL (ref 150–400)
RBC: 3.29 MIL/uL — ABNORMAL LOW (ref 3.87–5.11)
RDW: 12.7 % (ref 11.5–15.5)
WBC: 7 10*3/uL (ref 4.0–10.5)
nRBC: 0 % (ref 0.0–0.2)

## 2020-12-15 LAB — BASIC METABOLIC PANEL
Anion gap: 10 (ref 5–15)
BUN: 6 mg/dL (ref 6–20)
CO2: 23 mmol/L (ref 22–32)
Calcium: 8.9 mg/dL (ref 8.9–10.3)
Chloride: 102 mmol/L (ref 98–111)
Creatinine, Ser: 0.48 mg/dL (ref 0.44–1.00)
GFR, Estimated: >60 mL/min (ref >60)
Glucose, Bld: 93 mg/dL (ref 70–99)
Potassium: 3.8 mmol/L (ref 3.5–5.1)
Sodium: 135 mmol/L (ref 135–145)

## 2020-12-15 LAB — RESP PANEL BY RT-PCR (FLU A&B, COVID) ARPGX2
Influenza A by PCR: POSITIVE — AB
Influenza B by PCR: NEGATIVE
SARS Coronavirus 2 by RT PCR: NEGATIVE

## 2020-12-15 MED ORDER — OSELTAMIVIR PHOSPHATE 75 MG PO CAPS: 75.0000 mg | ORAL_CAPSULE | Freq: Two times a day (BID) | ORAL | 0 refills | Status: DC

## 2020-12-15 MED ORDER — ONDANSETRON 4 MG PO TBDP: 4.0000 mg | ORAL_TABLET | Freq: Once | ORAL | Status: DC

## 2020-12-15 MED ORDER — SODIUM CHLORIDE 0.9 % IV BOLUS
1000.0000 mL | Freq: Once | INTRAVENOUS | Status: AC
  Administered 2020-12-15: 1000 mL via INTRAVENOUS

## 2020-12-15 NOTE — ED Provider Notes (Signed)
[facility] EMERGENCY DEPT Provider Note   CSN: [id number] Arrival date & time: 12/15/20  1214     History Chief Complaint  Patient presents with   Cough    Melissa Conway is a 20 y.o. female.  HPI 20 year old female G1, P0 at [redacted] weeks gestation, history of Crohn's on Humira presents today with cough and petechia.  Reports that she coughed last night.  She states it was fairly severe.  She had some headache and nasal congestion.  She has been vaccinated for COVID and flu.  Today she went to the urgent care because of some chest pain with the coughing.  They noted petechiae on her face.  They sent her to the ED for further evaluation.     Past Medical History:  Diagnosis Date   Crohn's colitis [facility])    Depression     Patient Active Problem List   Diagnosis Date Noted   Anemia in pregnancy 12/02/2020   Obesity in pregnancy 11/02/2020   Psoriasis 09/06/2020   Anxiety and depression 09/06/2020   Supervision of low-risk first pregnancy 07/23/2020   Crohn disease ([place]) 07/23/2020    Past Surgical History:  Procedure Laterality Date   ADENOIDECTOMY     CHOLECYSTECTOMY     TONSILLECTOMY     TYMPANOSTOMY TUBE PLACEMENT       OB History     Gravida  1   Para      Term      Preterm      AB      Living         SAB      IAB      Ectopic      Multiple      Live Births              Family History  Problem Relation Age of Onset   Diabetes Mother    Depression Mother    OCD Mother    Depression Father    Melanoma Maternal Grandmother    Depression Maternal Grandmother    Anxiety disorder Maternal Grandmother    Colon cancer Maternal Grandfather    Diabetes Maternal Grandfather    Diabetes Paternal Grandfather    Heart disease Maternal Aunt    Non-Hodgkin's lymphoma Maternal Uncle     Social History   Tobacco Use   Smoking status: Never   Smokeless tobacco: Never  Vaping Use   Vaping Use: Never used  Substance Use Topics    Alcohol use: No   Drug use: No    Home Medications Prior to Admission medications   Medication Sig Start Date End Date Taking? Authorizing Provider  oseltamivir (TAMIFLU) 75 MG capsule Take 1 capsule (75 mg total) by mouth every 12 (twelve) hours. 12/15/20  Yes Pattricia Boss, MD  HUMIRA PEN 40 MG/0.4ML PNKT SMARTSIG:40 Milligram(s) SUB-Q Every 2 Weeks 07/02/20   [provider]  Multiple Vitamin (MULTIVITAMIN) tablet Take 1 tablet by mouth daily.    [provider]  polyethylene glycol (MIRALAX / GLYCOLAX) 17 g packet Take 17 g by mouth daily.    [provider]  sertraline (ZOLOFT) 25 MG tablet Take 1 tablet daily for 5 days then increase to 2 tablets daily. 07/23/20   Guss Bunde, MD    Allergies    Vancomycin, Penicillin g, and Penicillins  Review of Systems   Review of Systems  Physical Exam Updated Vital Signs BP 126/78   Pulse (!) 113   Temp 98.3  F (36.8 C)   Resp (!) 22   Ht 1.676 m (5\' 6" )   Wt 112 kg   LMP 04/09/2020   SpO2 98%   BMI 39.87 kg/m   Physical Exam Vitals reviewed.  Constitutional:      Appearance: Normal appearance.  HENT:     Head: Normocephalic.     Right Ear: External ear normal.     Left Ear: External ear normal.     Nose: Nose normal.     Mouth/Throat:     Mouth: Mucous membranes are moist.  Eyes:     Extraocular Movements: Extraocular movements intact.     Pupils: Pupils are equal, round, and reactive to light.  Cardiovascular:     Rate and Rhythm: Regular rhythm. Tachycardia present.  Pulmonary:     Effort: Pulmonary effort is normal.     Breath sounds: Normal breath sounds.  Abdominal:     Palpations: Abdomen is soft.     Comments: Abdomen is gravid consistent with dates  Musculoskeletal:        General: Normal range of motion.     Cervical back: Normal range of motion.  Skin:    General: Skin is warm and dry.     Capillary Refill: Capillary refill takes less than 2 seconds.  Neurological:      General: No focal deficit present.     Mental Status: She is alert.  Psychiatric:        Mood and Affect: Mood normal.        Behavior: Behavior normal.    ED Results / Procedures / Treatments   Labs (all labs ordered are listed, but only abnormal results are displayed) Labs Reviewed  RESP PANEL BY RT-PCR (FLU A&B, COVID) ARPGX2 - Abnormal; Notable for the following components:      Result Value   Influenza A by PCR POSITIVE (*)    All other components within normal limits  CBC - Abnormal; Notable for the following components:   RBC 3.29 (*)    Hemoglobin 10.3 (*)    HCT 30.4 (*)    All other components within normal limits  BASIC METABOLIC PANEL    EKG EKG Interpretation  Date/Time:  Saturday December 15 2020 13:36:13 EST Ventricular Rate:  113 PR Interval:  137 QRS Duration: 82 QT Interval:  329 QTC Calculation: 452 R Axis:   84 Text Interpretation: Sinus tachycardia Confirmed by Pattricia Boss [phone]) on 12/15/2020 2:49:08 PM  Radiology DG Chest 2 View  Result Date: 12/15/2020 CLINICAL DATA:  Cough. EXAM: CHEST - 2 VIEW COMPARISON:  None. FINDINGS: Cardiomediastinal silhouette is normal. Mediastinal contours appear intact. Elevation of the right hemidiaphragm. There is no evidence of focal airspace consolidation, pleural effusion or pneumothorax. Osseous structures are without acute abnormality. Soft tissues are grossly normal. IMPRESSION: No active cardiopulmonary disease. Elevation of the right hemidiaphragm, etiology uncertain. Electronically Signed   By: Fidela Salisbury M.D.   On: 12/15/2020 15:03    Procedures Procedures   Medications Ordered in ED Medications  sodium chloride 0.9 % bolus 1,000 mL (1,000 mLs Intravenous New Bag/Given 12/15/20 1503)    ED Course  I have reviewed the triage vital signs and the nursing notes.  Pertinent labs & imaging results that were available during my care of the patient were reviewed by me and considered in my medical  decision making (see chart for details).  Clinical Course as of 12/15/20 1517  Sat Dec 15, 2020  1447 Hemoglobin(!): 10.3 [DR]  1448 Hemoglobin is 10 stable from 2 weeks ago. She is not febrile here she has had her flu vaccine. Will give Tamiflu.  [DR]  1508 Discussed care with Dr. Macon Large.  She is aware of the episodes of hypertension.  The patient's blood pressure has normalized greatly without intervention.  He is receiving a liter of fluid.  She will be placed on Tamiflu.  She has a follow-up appointment on the 16th at [facility].  We have discussed that she should follow-up at [facility] and [facility] if she has any further problems.  She voices understanding. [DR]    Clinical Course User Index [DR] Margarita Grizzle, MD   MDM Rules/Calculators/A&P                          20 year old female [redacted] weeks gestation presents today with cough, petechiae likely from coughing, chest pain, and is flu positive.  She is mildly tachycardic here in the ED's some is this is consistent with her pregnancy.  Her initial blood pressure was elevated at 154/79.  Repeat is 138/80.  CBC is checked and normal.  I feel the petechia are secondary to the coughing.  Low index of suspicion for HELLP please get your Tamiflu filled and take it. Drink plenty of fluids Recheck at MAU if any further problems You have influenza A Please drink plenty of fluids Syndrome. Final Clinical Impression(s) / ED Diagnoses Final diagnoses:  Influenza A  [redacted] weeks gestation of pregnancy  Petechiae    Rx / DC Orders ED Discharge Orders          Ordered    oseltamivir (TAMIFLU) 75 MG capsule  Every 12 hours        12/15/20 1510             Margarita Grizzle, MD 12/15/20 1517

## 2020-12-15 NOTE — ED Notes (Signed)
Dc instructions reviewed with patient. Patient voiced understanding. Dc with belongings.  °

## 2020-12-15 NOTE — ED Notes (Signed)
Fetal heart tone obtained with doppler, strong heart rate 153.

## 2020-12-15 NOTE — ED Triage Notes (Signed)
Patient here POV from Home with Cough that began yesterday PM.  Patient was seen at [facility] for Same and was sent for "Blood Work" due to "Blood Splotches" on Face. Dry Cough. No Fevers at Home.  NAD Noted during Triage. A&Ox4. GCS 15. Ambulatory

## 2020-12-27 ENCOUNTER — Other Ambulatory Visit: Payer: Self-pay

## 2020-12-27 ENCOUNTER — Ambulatory Visit: Payer: 59 | Admitting: *Deleted

## 2020-12-27 ENCOUNTER — Ambulatory Visit: Payer: 59 | Attending: Obstetrics and Gynecology

## 2020-12-27 ENCOUNTER — Encounter: Payer: Self-pay | Admitting: *Deleted

## 2020-12-27 ENCOUNTER — Other Ambulatory Visit: Payer: Self-pay | Admitting: *Deleted

## 2020-12-27 VITALS — BP 142/73 | HR 83

## 2020-12-27 DIAGNOSIS — K5090 Crohn's disease, unspecified, without complications: Secondary | ICD-10-CM | POA: Insufficient documentation

## 2020-12-27 DIAGNOSIS — O99613 Diseases of the digestive system complicating pregnancy, third trimester: Secondary | ICD-10-CM | POA: Diagnosis not present

## 2020-12-27 DIAGNOSIS — O99210 Obesity complicating pregnancy, unspecified trimester: Secondary | ICD-10-CM

## 2020-12-27 DIAGNOSIS — Z3A32 32 weeks gestation of pregnancy: Secondary | ICD-10-CM | POA: Insufficient documentation

## 2020-12-27 DIAGNOSIS — Z362 Encounter for other antenatal screening follow-up: Secondary | ICD-10-CM | POA: Insufficient documentation

## 2020-12-27 DIAGNOSIS — O99013 Anemia complicating pregnancy, third trimester: Secondary | ICD-10-CM

## 2020-12-27 DIAGNOSIS — O99213 Obesity complicating pregnancy, third trimester: Secondary | ICD-10-CM | POA: Diagnosis not present

## 2020-12-27 DIAGNOSIS — E669 Obesity, unspecified: Secondary | ICD-10-CM | POA: Diagnosis not present

## 2020-12-27 DIAGNOSIS — O99612 Diseases of the digestive system complicating pregnancy, second trimester: Secondary | ICD-10-CM | POA: Diagnosis present

## 2020-12-27 DIAGNOSIS — O10913 Unspecified pre-existing hypertension complicating pregnancy, third trimester: Secondary | ICD-10-CM

## 2020-12-27 DIAGNOSIS — Z3403 Encounter for supervision of normal first pregnancy, third trimester: Secondary | ICD-10-CM | POA: Diagnosis present

## 2020-12-28 ENCOUNTER — Ambulatory Visit (INDEPENDENT_AMBULATORY_CARE_PROVIDER_SITE_OTHER): Payer: 59 | Admitting: Certified Nurse Midwife

## 2020-12-28 VITALS — BP 116/72 | HR 84 | Wt 251.0 lb

## 2020-12-28 DIAGNOSIS — O99210 Obesity complicating pregnancy, unspecified trimester: Secondary | ICD-10-CM

## 2020-12-28 DIAGNOSIS — Z3A32 32 weeks gestation of pregnancy: Secondary | ICD-10-CM

## 2020-12-28 DIAGNOSIS — Z3403 Encounter for supervision of normal first pregnancy, third trimester: Secondary | ICD-10-CM

## 2020-12-28 NOTE — Progress Notes (Signed)
Subjective:  Melissa Conway is a 20 y.o. G1P0 at [redacted]w[redacted]d being seen today for ongoing prenatal care.  She is currently monitored for the following issues for this low-risk pregnancy and has Supervision of low-risk first pregnancy; Crohn disease (HCC); Psoriasis; Anxiety and depression; Obesity in pregnancy; and Anemia in pregnancy on their problem list.  Patient reports  swelling in hands, legs, feet; worse after standing .  Contractions: Not present. Vag. Bleeding: None.  Movement: Present. Denies leaking of fluid.   The following portions of the patient's history were reviewed and updated as appropriate: allergies, current medications, past family history, past medical history, past social history, past surgical history and problem list. Problem list updated.  Objective:   Vitals:   12/28/20 0818  BP: 116/72  Pulse: 84  Weight: 251 lb (113.9 kg)    Fetal Status: Fetal Heart Rate (bpm): 130 Fundal Height: 34 cm Movement: Present  Presentation: Vertex  General:  Alert, oriented and cooperative. Patient is in no acute distress.  Skin: Skin is warm and dry. No rash noted.   Cardiovascular: Normal heart rate noted  Respiratory: Normal respiratory effort, no problems with respiration noted  Abdomen: Soft, gravid, appropriate for gestational age. Pain/Pressure: Absent     Pelvic: Vag. Bleeding: None Vag D/C Character: Thin   Cervical exam deferred        Extremities: Normal range of motion.  Edema: Trace  Mental Status: Normal mood and affect. Normal behavior. Normal judgment and thought content.   Urinalysis:      Assessment and Plan:  Pregnancy: G1P0 at [redacted]w[redacted]d  1. Encounter for supervision of low-risk first pregnancy in third trimester  2. [redacted] weeks gestation of pregnancy  3. Obesity in pregnancy   4. Dependent edema - trace edema to feet - normotensive - PEC precautions  Preterm labor symptoms and general obstetric precautions including but not limited to vaginal bleeding,  contractions, leaking of fluid and fetal movement were reviewed in detail with the patient. Please refer to After Visit Summary for other counseling recommendations.  Return in about 2 weeks (around 01/11/2021).   Donette Larry, CNM

## 2021-01-11 ENCOUNTER — Ambulatory Visit (INDEPENDENT_AMBULATORY_CARE_PROVIDER_SITE_OTHER): Payer: 59 | Admitting: Certified Nurse Midwife

## 2021-01-11 ENCOUNTER — Other Ambulatory Visit: Payer: Self-pay

## 2021-01-11 ENCOUNTER — Inpatient Hospital Stay (HOSPITAL_COMMUNITY)
Admission: AD | Admit: 2021-01-11 | Discharge: 2021-01-12 | Disposition: A | Discharge status: Home / Self Care | Payer: 59 | Attending: Obstetrics and Gynecology | Admitting: Obstetrics and Gynecology

## 2021-01-11 ENCOUNTER — Encounter (HOSPITAL_COMMUNITY): Payer: Self-pay | Admitting: Obstetrics and Gynecology

## 2021-01-11 VITALS — BP 122/78 | HR 100 | Wt 251.0 lb

## 2021-01-11 DIAGNOSIS — M545 Low back pain, unspecified: Secondary | ICD-10-CM | POA: Insufficient documentation

## 2021-01-11 DIAGNOSIS — R109 Unspecified abdominal pain: Secondary | ICD-10-CM | POA: Insufficient documentation

## 2021-01-11 DIAGNOSIS — O26893 Other specified pregnancy related conditions, third trimester: Secondary | ICD-10-CM | POA: Insufficient documentation

## 2021-01-11 DIAGNOSIS — K9289 Other specified diseases of the digestive system: Secondary | ICD-10-CM | POA: Insufficient documentation

## 2021-01-11 DIAGNOSIS — O99210 Obesity complicating pregnancy, unspecified trimester: Secondary | ICD-10-CM

## 2021-01-11 DIAGNOSIS — Z3A34 34 weeks gestation of pregnancy: Secondary | ICD-10-CM | POA: Insufficient documentation

## 2021-01-11 DIAGNOSIS — Z3403 Encounter for supervision of normal first pregnancy, third trimester: Secondary | ICD-10-CM

## 2021-01-11 DIAGNOSIS — R197 Diarrhea, unspecified: Secondary | ICD-10-CM | POA: Insufficient documentation

## 2021-01-11 DIAGNOSIS — O99891 Other specified diseases and conditions complicating pregnancy: Secondary | ICD-10-CM

## 2021-01-11 DIAGNOSIS — M549 Dorsalgia, unspecified: Secondary | ICD-10-CM

## 2021-01-11 DIAGNOSIS — O212 Late vomiting of pregnancy: Secondary | ICD-10-CM | POA: Insufficient documentation

## 2021-01-11 DIAGNOSIS — O99013 Anemia complicating pregnancy, third trimester: Secondary | ICD-10-CM

## 2021-01-11 DIAGNOSIS — R112 Nausea with vomiting, unspecified: Secondary | ICD-10-CM

## 2021-01-11 DIAGNOSIS — O99613 Diseases of the digestive system complicating pregnancy, third trimester: Secondary | ICD-10-CM | POA: Insufficient documentation

## 2021-01-11 HISTORY — DX: Crohn's disease, unspecified, without complications: K50.90

## 2021-01-11 NOTE — MAU Note (Signed)
PT SAYS AT 8AM- WAS V/D  4PM- ATE APPLE  7PM- VOMITING  LOWER ABD/ BACK  PAIN- STARTED 830PM PNC WITH [facility]

## 2021-01-11 NOTE — Progress Notes (Signed)
Subjective:  Melissa Conway is a 20 y.o. G1P0 at [redacted]w[redacted]d being seen today for ongoing prenatal care.  She is currently monitored for the following issues for this high-risk pregnancy and has Supervision of low-risk first pregnancy; Crohn disease (HCC); Psoriasis; Anxiety and depression; Obesity in pregnancy; and Anemia in pregnancy on their problem list.  Patient reports no complaints and LE edema is improved .  Contractions: Not present. Vag. Bleeding: None.  Movement: Present. Denies leaking of fluid.   The following portions of the patient's history were reviewed and updated as appropriate: allergies, current medications, past family history, past medical history, past social history, past surgical history and problem list. Problem list updated.  Objective:   Vitals:   01/11/21 0844  BP: 122/78  Pulse: 100  Weight: 251 lb (113.9 kg)    Fetal Status: Fetal Heart Rate (bpm): 136 Fundal Height: 35 cm Movement: Present  Presentation: Vertex  General:  Alert, oriented and cooperative. Patient is in no acute distress.  Skin: Skin is warm and dry. No rash noted.   Cardiovascular: Normal heart rate noted  Respiratory: Normal respiratory effort, no problems with respiration noted  Abdomen: Soft, gravid, appropriate for gestational age. Pain/Pressure: Absent     Pelvic: Vag. Bleeding: None Vag D/C Character: Thin   Cervical exam deferred        Extremities: Normal range of motion.  Edema: Trace  Mental Status: Normal mood and affect. Normal behavior. Normal judgment and thought content.   Urinalysis:      Assessment and Plan:  Pregnancy: G1P0 at [redacted]w[redacted]d  1. Encounter for supervision of low-risk first pregnancy in third trimester  2. [redacted] weeks gestation of pregnancy - GBS next visit - planning waterbirth, brought certificate, needs to sign consent, no barriers to WB at this time  3. Obesity, morbid (more than 100 lbs over ideal weight or BMI > 40) (HCC) - [place] MFM FETAL BPP WO NON STRESS;  Future - continue weekly AT, delivery 39-40 wks  Preterm labor symptoms and general obstetric precautions including but not limited to vaginal bleeding, contractions, leaking of fluid and fetal movement were reviewed in detail with the patient. Please refer to After Visit Summary for other counseling recommendations.  Return in about 2 weeks (around 01/25/2021).   Donette Larry, CNM

## 2021-01-12 ENCOUNTER — Encounter (HOSPITAL_COMMUNITY): Payer: Self-pay | Admitting: Obstetrics and Gynecology

## 2021-01-12 DIAGNOSIS — O99210 Obesity complicating pregnancy, unspecified trimester: Secondary | ICD-10-CM

## 2021-01-12 DIAGNOSIS — O99013 Anemia complicating pregnancy, third trimester: Secondary | ICD-10-CM | POA: Diagnosis not present

## 2021-01-12 DIAGNOSIS — R197 Diarrhea, unspecified: Secondary | ICD-10-CM

## 2021-01-12 DIAGNOSIS — R112 Nausea with vomiting, unspecified: Secondary | ICD-10-CM

## 2021-01-12 DIAGNOSIS — K9289 Other specified diseases of the digestive system: Secondary | ICD-10-CM | POA: Diagnosis not present

## 2021-01-12 DIAGNOSIS — R109 Unspecified abdominal pain: Secondary | ICD-10-CM | POA: Diagnosis not present

## 2021-01-12 DIAGNOSIS — O26893 Other specified pregnancy related conditions, third trimester: Secondary | ICD-10-CM | POA: Diagnosis present

## 2021-01-12 DIAGNOSIS — O212 Late vomiting of pregnancy: Secondary | ICD-10-CM | POA: Diagnosis not present

## 2021-01-12 DIAGNOSIS — O99613 Diseases of the digestive system complicating pregnancy, third trimester: Secondary | ICD-10-CM | POA: Diagnosis not present

## 2021-01-12 DIAGNOSIS — Z3A34 34 weeks gestation of pregnancy: Secondary | ICD-10-CM | POA: Diagnosis not present

## 2021-01-12 DIAGNOSIS — M545 Low back pain, unspecified: Secondary | ICD-10-CM | POA: Diagnosis not present

## 2021-01-12 LAB — URINALYSIS, ROUTINE W REFLEX MICROSCOPIC
Bilirubin Urine: NEGATIVE
Glucose, UA: NEGATIVE mg/dL
Ketones, ur: 20 mg/dL — AB
Nitrite: NEGATIVE
Protein, ur: 30 mg/dL — AB
Specific Gravity, Urine: 1.026 (ref 1.005–1.030)
pH: 5 (ref 5.0–8.0)

## 2021-01-12 MED ORDER — CYCLOBENZAPRINE HCL 5 MG PO TABS
10.0000 mg | ORAL_TABLET | Freq: Once | ORAL | Status: AC
  Administered 2021-01-12: 10 mg via ORAL
  Filled 2021-01-12: qty 2

## 2021-01-12 MED ORDER — CYCLOBENZAPRINE HCL 10 MG PO TABS: 10.0000 mg | ORAL_TABLET | Freq: Two times a day (BID) | ORAL | 0 refills | Status: DC | PRN

## 2021-01-12 MED ORDER — ONDANSETRON 4 MG PO TBDP: 4.0000 mg | ORAL_TABLET | Freq: Three times a day (TID) | ORAL | 0 refills | Status: DC | PRN

## 2021-01-12 MED ORDER — ONDANSETRON 4 MG PO TBDP
8.0000 mg | ORAL_TABLET | Freq: Once | ORAL | Status: AC
  Administered 2021-01-12: 8 mg via ORAL
  Filled 2021-01-12: qty 2

## 2021-01-12 NOTE — MAU Provider Note (Signed)
History     CSN: [id number]  Arrival date and time: 01/11/21 2156   Event Date/Time   First Provider Initiated Contact with Patient 01/12/21 0009      Chief Complaint  Patient presents with   Abdominal Pain   HPI [name] is a 20 y.o. year old G12P0 female at [redacted]w[redacted]d weeks gestation who presents to MAU reporting she started having N/V/D @ 0800. She ate an apple @ 1600. She vomited at 1900. She ate some saltine crackers at 1930. She reports diarrhea at 2000. She complains of pain on both of her sides (pointing to bilateral flank area) and low back pain since 2030. She reports good (+) FM. Her significant other is present and contributing to the history taking.   OB History     Gravida  1   Para      Term      Preterm      AB      Living         SAB      IAB      Ectopic      Multiple      Live Births              Past Medical History:  Diagnosis Date   Crohn disease ([place])    Crohn's colitis ([place])    Depression     Past Surgical History:  Procedure Laterality Date   ADENOIDECTOMY     CHOLECYSTECTOMY     TONSILLECTOMY     TYMPANOSTOMY TUBE PLACEMENT      Family History  Problem Relation Age of Onset   Diabetes Mother    Depression Mother    OCD Mother    Depression Father    Melanoma Maternal Grandmother    Depression Maternal Grandmother    Anxiety disorder Maternal Grandmother    Colon cancer Maternal Grandfather    Diabetes Maternal Grandfather    Diabetes Paternal Grandfather    Heart disease Maternal Aunt    Non-Hodgkin's lymphoma Maternal Uncle     Social History   Tobacco Use   Smoking status: Never   Smokeless tobacco: Never  Vaping Use   Vaping Use: Never used  Substance Use Topics   Alcohol use: No   Drug use: No    Allergies:  Allergies  Allergen Reactions   Vancomycin Swelling    SWELLING REACTION UNSPECIFIED    Penicillin G Hives   Penicillins Hives    Has patient had a PCN reaction causing  immediate rash, facial/tongue/throat swelling, SOB or lightheadedness with hypotension: YES Has patient had a PCN reaction causing severe rash involving mucus membranes or skin necrosis: NO Has patient had a PCN reaction that required hospitalization NO Has patient had a PCN reaction occurring within the last 10 years: NO If all of the above answers are "NO", then may proceed with Cephalosporin use.    Medications Prior to Admission  Medication Sig Dispense Refill Last Dose   HUMIRA PEN 40 MG/0.4ML PNKT SMARTSIG:40 Milligram(s) SUB-Q Every 2 Weeks   01/11/2021   Multiple Vitamin (MULTIVITAMIN) tablet Take 1 tablet by mouth daily.   01/11/2021   polyethylene glycol (MIRALAX / GLYCOLAX) 17 g packet Take 17 g by mouth daily.   01/11/2021   Prenatal Vit-Fe Fumarate-FA (PRENATAL MULTIVITAMIN) TABS tablet Take 1 tablet by mouth daily at 12 noon.      sertraline (ZOLOFT) 25 MG tablet Take 1 tablet daily for 5 days then increase to 2  tablets daily. 60 tablet 0 Past Month    Review of Systems  Constitutional: Negative.   HENT: Negative.    Eyes: Negative.   Respiratory: Negative.    Cardiovascular: Negative.   Gastrointestinal:  Positive for diarrhea, nausea and vomiting.  Endocrine: Negative.   Genitourinary: Negative.   Musculoskeletal:  Positive for back pain.  Skin: Negative.   Allergic/Immunologic: Negative.   Neurological: Negative.   Hematological: Negative.   Psychiatric/Behavioral: Negative.    Physical Exam   Blood pressure 137/85, pulse (!) 106, temperature 98.6 F (37 C), temperature source Oral, resp. rate 18, height 5\' 6"  (1.676 m), weight 113.7 kg, last menstrual period 04/09/2020, SpO2 99 %.  Physical Exam Vitals and nursing note reviewed. Exam conducted with a chaperone present.  Constitutional:      Appearance: Normal appearance. She is obese.  Cardiovascular:     Rate and Rhythm: Tachycardia present.  Pulmonary:     Effort: Pulmonary effort is normal.  Abdominal:      Palpations: Abdomen is soft.  Genitourinary:    Comments: deferred Skin:    General: Skin is warm and dry.  Neurological:     Mental Status: She is alert and oriented to person, place, and time.  Psychiatric:        Mood and Affect: Mood normal.        Behavior: Behavior normal.        Thought Content: Thought content normal.        Judgment: Judgment normal.   REACTIVE NST - FHR: 125 bpm / moderate variability / accels present / decels absent / TOCO: irregular UC's with UI noted   MAU Course  Procedures  MDM CCUA Zofran 8 mg ODT -- relieved nausea "feels better" Flexeril 10 mg -- relieved back and flank pain Results for orders placed or performed during the hospital encounter of 01/11/21 (from the past 24 hour(s))  Urinalysis, Routine w reflex microscopic Urine, Clean Catch     Status: Abnormal   Collection Time: 01/11/21 11:44 PM  Result Value Ref Range   Color, Urine AMBER (A) YELLOW   APPearance HAZY (A) CLEAR   Specific Gravity, Urine 1.026 1.005 - 1.030   pH 5.0 5.0 - 8.0   Glucose, UA NEGATIVE NEGATIVE mg/dL   Hgb urine dipstick MODERATE (A) NEGATIVE   Bilirubin Urine NEGATIVE NEGATIVE   Ketones, ur 20 (A) NEGATIVE mg/dL   Protein, ur 30 (A) NEGATIVE mg/dL   Nitrite NEGATIVE NEGATIVE   Leukocytes,Ua TRACE (A) NEGATIVE   RBC / HPF 6-10 0 - 5 RBC/hpf   Bacteria, UA RARE (A) NONE SEEN   Squamous Epithelial / LPF 11-20 0 - 5   Mucus PRESENT      Assessment and Plan  Nausea vomiting and diarrhea - Rx for Zofran 4 mg ODT every 8 hr prn N/V - Advised may have a GI virus that will need to run its course   Back pain affecting pregnancy in third trimester - Rx for Flexeril sent   [redacted] weeks gestation of pregnancy   - Discharge patient - Keep scheduled appt with [facility] on 01/24/21 - Patient verbalized an understanding of the plan of care and agrees.  03/24/21, CNM 01/12/2021, 12:09 AM

## 2021-01-13 NOTE — L&D Delivery Note (Addendum)
Delivery Note Melissa Conway is a 21 y.o. G1P0 at [redacted]w[redacted]d admitted for gHTN.   GBS Status: Negative/-- (01/12 0000) Maximum Maternal Temperature: 99.9  Labor course: Initial SVE: 1/60/-3. Augmentation with: AROM, Pitocin, and Cytotec. She then progressed to complete.  ROM: 9h 55m with clear/bloody fluid  Birth: At 0109 a viable female was delivered via spontaneous vaginal delivery (Presentation: LOA ). Nuchal cord present: Yes, loose nuchal reduced after delivery of infant and umbilical cord with a true knot.  Shoulders and body delivered in usual fashion. Infant placed directly on mom's abdomen for bonding/skin-to-skin, baby dried and stimulated. Cord clamped x 2 after 1 minute and cut by FOB.  Cord blood collected. The periurethral/periclitoral area was carefully inspected due to brisk bleeding noted from this area and patient was noted to have a periclitoral laceration that was repaired by Wynelle Bourgeois, CNM. This area was noted to be hemostatic following repair, thus placental delivery was pursued. The placenta separated spontaneously and delivered via gentle cord traction.  Pitocin infused rapidly IV per protocol.  Fundus firm with massage. The perineum, vagina and cervix were then carefully inspected and the patient was noted to have a U-shaped 2nd degree perineal laceration that was repaired in addition to some perineal abrasions that were hemostatic and did not require repair. Fundus was again noted to be firm with massage. Placenta inspected and appears to be intact with a 3 VC.  Placenta/Cord with the following complications: none .  Sponge and instrument count were correct x2.  Intrapartum complications:  Gestational HTN Anesthesia:  epidural Episiotomy: none Lacerations:  2nd degree perineal and periclitoral/periurethral  Suture Repair: 3.0 Monocryl EBL (mL): 700   Infant: APGAR (1 MIN): 9   APGAR (5 MINS): 9   APGAR (10 MINS):    Infant weight: pending  Mom to postpartum.  Baby  to Couplet care / Skin to Skin. Placenta to L&D   Plans to Breastfeed Contraception: IUD Circumcision: declines  Note sent to [facility]: [facility] for pp visit.  Raylene Everts, MD 01/31/2021 2:26 AM   Attestation:  I confirm that I have verified the information documented in the residents note and that I have also personally reperformed the physical exam and all medical decision making activities.   I was gloved and present for entire delivery SVD without incident No difficulty with shoulders Lacerations as listed above Repair of same supervised by me Aviva Signs, CNM

## 2021-01-21 ENCOUNTER — Telehealth: Payer: Self-pay | Admitting: *Deleted

## 2021-01-21 NOTE — Telephone Encounter (Signed)
No answer/voicemail to ask patient about coming at 10:50 instead of 11:10 AM on 01/24/2021, had to overbook another patient with her.

## 2021-01-24 ENCOUNTER — Other Ambulatory Visit: Payer: Self-pay

## 2021-01-24 ENCOUNTER — Other Ambulatory Visit (HOSPITAL_COMMUNITY)
Admission: RE | Admit: 2021-01-24 | Discharge: 2021-01-24 | Disposition: A | Discharge status: Home / Self Care | Payer: 59 | Source: Ambulatory Visit | Attending: Obstetrics and Gynecology | Admitting: Obstetrics and Gynecology

## 2021-01-24 ENCOUNTER — Encounter: Payer: Self-pay | Admitting: Obstetrics and Gynecology

## 2021-01-24 ENCOUNTER — Ambulatory Visit (HOSPITAL_BASED_OUTPATIENT_CLINIC_OR_DEPARTMENT_OTHER): Payer: 59

## 2021-01-24 ENCOUNTER — Ambulatory Visit: Payer: 59 | Admitting: *Deleted

## 2021-01-24 ENCOUNTER — Ambulatory Visit (INDEPENDENT_AMBULATORY_CARE_PROVIDER_SITE_OTHER): Payer: 59 | Admitting: Obstetrics and Gynecology

## 2021-01-24 VITALS — BP 123/79 | HR 96 | Wt 254.0 lb

## 2021-01-24 VITALS — BP 139/75 | HR 97

## 2021-01-24 DIAGNOSIS — O99013 Anemia complicating pregnancy, third trimester: Secondary | ICD-10-CM

## 2021-01-24 DIAGNOSIS — Z3403 Encounter for supervision of normal first pregnancy, third trimester: Secondary | ICD-10-CM

## 2021-01-24 DIAGNOSIS — Z3A36 36 weeks gestation of pregnancy: Secondary | ICD-10-CM | POA: Diagnosis not present

## 2021-01-24 DIAGNOSIS — O10013 Pre-existing essential hypertension complicating pregnancy, third trimester: Secondary | ICD-10-CM

## 2021-01-24 DIAGNOSIS — O99210 Obesity complicating pregnancy, unspecified trimester: Secondary | ICD-10-CM

## 2021-01-24 DIAGNOSIS — O10913 Unspecified pre-existing hypertension complicating pregnancy, third trimester: Secondary | ICD-10-CM | POA: Insufficient documentation

## 2021-01-24 DIAGNOSIS — K5000 Crohn's disease of small intestine without complications: Secondary | ICD-10-CM

## 2021-01-24 LAB — OB RESULTS CONSOLE GC/CHLAMYDIA: Gonorrhea: NEGATIVE

## 2021-01-24 LAB — OB RESULTS CONSOLE GBS: GBS: NEGATIVE

## 2021-01-24 NOTE — Progress Notes (Signed)
° °  PRENATAL VISIT NOTE  Subjective:  Melissa Conway is a 21 y.o. G1P0 at [redacted]w[redacted]d being seen today for ongoing prenatal care.  She is currently monitored for the following issues for this low-risk pregnancy and has Supervision of low-risk first pregnancy; Crohn disease ([place]); Psoriasis; Anxiety and depression; Obesity in pregnancy; and Anemia in pregnancy on their problem list.  Patient reports no complaints.  Contractions: Not present. Vag. Bleeding: None.  Movement: Present. Denies leaking of fluid.   The following portions of the patient's history were reviewed and updated as appropriate: allergies, current medications, past family history, past medical history, past social history, past surgical history and problem list.   Objective:   Vitals:   01/24/21 1047  BP: 123/79  Pulse: 96  Weight: 254 lb (115.2 kg)    Fetal Status: Fetal Heart Rate (bpm): 136   Movement: Present     General:  Alert, oriented and cooperative. Patient is in no acute distress.  Skin: Skin is warm and dry. No rash noted.   Cardiovascular: Normal heart rate noted  Respiratory: Normal respiratory effort, no problems with respiration noted  Abdomen: Soft, gravid, appropriate for gestational age.  Pain/Pressure: Absent     Pelvic: Cervical exam performed in the presence of a chaperone        Extremities: Normal range of motion.  Edema: Trace  Mental Status: Normal mood and affect. Normal behavior. Normal judgment and thought content.   Assessment and Plan:  Pregnancy: G1P0 at [redacted]w[redacted]d 1. Encounter for supervision of low-risk first pregnancy in third trimester GBS/GC/CT done today Reviewed SOL and when to go to the hospital Forms given for waterbirth consent again today  2. Obesity in pregnancy Has f/u growth today Growth on 12/15 was 0000000 and cephalic.   3. Anemia during pregnancy in third trimester Reviewed HgB of 10.3 from 28w labs and MAU. Taking iron supplement.   4. Crohns disease Has Crohns - no  active flare during pregnancy and currently controlled with humara. Had fissures and severe constipation are symptoms of flare.   Preterm labor symptoms and general obstetric precautions including but not limited to vaginal bleeding, contractions, leaking of fluid and fetal movement were reviewed in detail with the patient. Please refer to After Visit Summary for other counseling recommendations.   No follow-ups on file.  Future Appointments  Date Time Provider [place]  01/24/2021  1:30 PM [facility] NURSE [facility]  01/24/2021  1:45 PM [facility] US4 [facility]  01/29/2021  9:30 AM [facility] NURSE [facility]  01/29/2021  9:50 AM Rasch, Artist Pais, NP [facility]  02/01/2021  9:00 AM [facility] NURSE [facility]  02/05/2021  9:30 AM [facility] NURSE [facility]  02/05/2021 10:10 AM Leftwich-Kirby, Kathie Dike, CNM [facility]  02/08/2021  9:00 AM [facility] NURSE [facility]  02/12/2021  8:50 AM Rasch, Artist Pais, NP [facility]  02/12/2021  9:30 AM [place] [facility]  02/15/2021  8:50 AM [facility] NURSE [facility]  02/19/2021  8:50 AM Elvera Maria, CNM [facility]  02/19/2021  9:30 AM [facility] NURSE [facility]  02/22/2021  9:00 AM [place]    Radene Gunning, MD

## 2021-01-25 LAB — CERVICOVAGINAL ANCILLARY ONLY
Chlamydia: NEGATIVE
Comment: NEGATIVE
Comment: NORMAL
Neisseria Gonorrhea: NEGATIVE

## 2021-01-27 LAB — CULTURE, STREPTOCOCCUS GRP B W/SUSCEPT
MICRO NUMBER:: 12865837
SPECIMEN QUALITY:: ADEQUATE

## 2021-01-29 ENCOUNTER — Encounter (HOSPITAL_COMMUNITY): Payer: Self-pay | Admitting: Family Medicine

## 2021-01-29 ENCOUNTER — Inpatient Hospital Stay (HOSPITAL_COMMUNITY)
Admission: AD | Admit: 2021-01-29 | Discharge: 2021-02-01 | DRG: 807 | Disposition: A | Discharge status: Home / Self Care | Payer: 59 | Attending: Obstetrics and Gynecology | Admitting: Obstetrics and Gynecology

## 2021-01-29 ENCOUNTER — Ambulatory Visit (INDEPENDENT_AMBULATORY_CARE_PROVIDER_SITE_OTHER): Payer: 59 | Admitting: *Deleted

## 2021-01-29 ENCOUNTER — Ambulatory Visit (INDEPENDENT_AMBULATORY_CARE_PROVIDER_SITE_OTHER): Payer: 59 | Admitting: Obstetrics and Gynecology

## 2021-01-29 ENCOUNTER — Other Ambulatory Visit: Payer: Self-pay

## 2021-01-29 VITALS — BP 141/72 | HR 104 | Wt 254.0 lb

## 2021-01-29 DIAGNOSIS — O134 Gestational [pregnancy-induced] hypertension without significant proteinuria, complicating childbirth: Principal | ICD-10-CM | POA: Diagnosis present

## 2021-01-29 DIAGNOSIS — O99214 Obesity complicating childbirth: Secondary | ICD-10-CM | POA: Diagnosis present

## 2021-01-29 DIAGNOSIS — Z8759 Personal history of other complications of pregnancy, childbirth and the puerperium: Secondary | ICD-10-CM | POA: Insufficient documentation

## 2021-01-29 DIAGNOSIS — O9902 Anemia complicating childbirth: Secondary | ICD-10-CM | POA: Diagnosis present

## 2021-01-29 DIAGNOSIS — Z3403 Encounter for supervision of normal first pregnancy, third trimester: Secondary | ICD-10-CM

## 2021-01-29 DIAGNOSIS — O133 Gestational [pregnancy-induced] hypertension without significant proteinuria, third trimester: Secondary | ICD-10-CM

## 2021-01-29 DIAGNOSIS — Z20822 Contact with and (suspected) exposure to covid-19: Secondary | ICD-10-CM | POA: Diagnosis present

## 2021-01-29 DIAGNOSIS — Z3A37 37 weeks gestation of pregnancy: Secondary | ICD-10-CM

## 2021-01-29 LAB — RESP PANEL BY RT-PCR (FLU A&B, COVID) ARPGX2
Influenza A by PCR: NEGATIVE
Influenza B by PCR: NEGATIVE
SARS Coronavirus 2 by RT PCR: NEGATIVE

## 2021-01-29 LAB — TYPE AND SCREEN
ABO/RH(D): O POS
Antibody Screen: NEGATIVE

## 2021-01-29 LAB — CBC
HCT: 31.3 % — ABNORMAL LOW (ref 36.0–46.0)
Hemoglobin: 10.6 g/dL — ABNORMAL LOW (ref 12.0–15.0)
MCH: 30.9 pg (ref 26.0–34.0)
MCHC: 33.9 g/dL (ref 30.0–36.0)
MCV: 91.3 fL (ref 80.0–100.0)
Platelets: 231 10*3/uL (ref 150–400)
RBC: 3.43 MIL/uL — ABNORMAL LOW (ref 3.87–5.11)
RDW: 13.2 % (ref 11.5–15.5)
WBC: 9.5 10*3/uL (ref 4.0–10.5)
nRBC: 0 % (ref 0.0–0.2)

## 2021-01-29 MED ORDER — LACTATED RINGERS IV SOLN: INTRAVENOUS | Status: DC

## 2021-01-29 MED ORDER — MISOPROSTOL 25 MCG QUARTER TABLET
ORAL_TABLET | ORAL | Status: AC
  Filled 2021-01-29: qty 1

## 2021-01-29 MED ORDER — TERBUTALINE SULFATE 1 MG/ML IJ SOLN: 0.2500 mg | Freq: Once | INTRAMUSCULAR | Status: DC | PRN

## 2021-01-29 MED ORDER — MISOPROSTOL 25 MCG QUARTER TABLET
25.0000 ug | ORAL_TABLET | ORAL | Status: DC | PRN
  Administered 2021-01-29 (×2): 25 ug via VAGINAL
  Filled 2021-01-29: qty 1

## 2021-01-29 MED ORDER — OXYTOCIN BOLUS FROM INFUSION
333.0000 mL | Freq: Once | INTRAVENOUS | Status: AC
  Administered 2021-01-31: 333 mL via INTRAVENOUS

## 2021-01-29 MED ORDER — SOD CITRATE-CITRIC ACID 500-334 MG/5ML PO SOLN: 30.0000 mL | ORAL | Status: DC | PRN

## 2021-01-29 MED ORDER — ACETAMINOPHEN 325 MG PO TABS: 650.0000 mg | ORAL_TABLET | ORAL | Status: DC | PRN

## 2021-01-29 MED ORDER — ONDANSETRON HCL 4 MG/2ML IJ SOLN: 4.0000 mg | Freq: Four times a day (QID) | INTRAMUSCULAR | Status: DC | PRN

## 2021-01-29 MED ORDER — OXYCODONE-ACETAMINOPHEN 5-325 MG PO TABS: 2.0000 | ORAL_TABLET | ORAL | Status: DC | PRN

## 2021-01-29 MED ORDER — LACTATED RINGERS IV SOLN
500.0000 mL | INTRAVENOUS | Status: DC | PRN
  Administered 2021-01-30: 500 mL via INTRAVENOUS

## 2021-01-29 MED ORDER — OXYTOCIN-SODIUM CHLORIDE 30-0.9 UT/500ML-% IV SOLN
INTRAVENOUS | Status: AC
  Filled 2021-01-29: qty 500

## 2021-01-29 MED ORDER — OXYCODONE-ACETAMINOPHEN 5-325 MG PO TABS: 1.0000 | ORAL_TABLET | ORAL | Status: DC | PRN

## 2021-01-29 MED ORDER — OXYTOCIN-SODIUM CHLORIDE 30-0.9 UT/500ML-% IV SOLN
1.0000 m[IU]/min | INTRAVENOUS | Status: DC
  Administered 2021-01-29: 2 m[IU]/min via INTRAVENOUS
  Filled 2021-01-29: qty 500

## 2021-01-29 MED ORDER — LIDOCAINE HCL (PF) 1 % IJ SOLN: 30.0000 mL | INTRAMUSCULAR | Status: DC | PRN

## 2021-01-29 NOTE — Progress Notes (Signed)
S: Patient reports feeling contractions, but still coping well. Patient has no questions/ concerns at this time. Significant other at bedside.   O: Vitals:   01/29/21 1300 01/29/21 1322 01/29/21 1453 01/29/21 1728  BP: 130/80  128/67 126/76  Pulse: (!) 112  88 91  Resp:  18  16  Temp:  (!) 97.4 F (36.3 C)    SpO2:  99%    Weight:  117.8 kg    Height:  5\' 6"  (1.676 m)      FHT:  FHR: 140 bpm, variability: moderate,  accelerations:  Present,  decelerations:  Absent UC:   irregular, every 1.5-3 minutes SVE:   Dilation: 3 Effacement (%): 60 Station: -3 Exam by:: [employer], CNM student (placing cytotec)  A / P: 21 y.o. G1P0 at [redacted]w[redacted]d IOL gHTN progressing well after 1 dose of Cytotec. -Another dose of Cytotec placed for cervical ripening  -Foley balloon no longer needed due cervical change  -Reassess cervical exam in 4 hours and Pitocin for labor augmentation and the patient agrees with the plan -GBS negative  Fetal Wellbeing:  Category I Pain Control:  Labor support without medications Anticipated MOD:  NSVD  Christiane Ha, SNM 01/29/2021, 5:40 PM

## 2021-01-29 NOTE — Progress Notes (Signed)
Melissa Conway is a 21 y.o. G1P0 at [redacted]w[redacted]d by ultrasound admitted for induction of labor due to [facility].  Subjective: Doing well, reports some increased discomfort with CTX but tolerable. FOB at the bedside. No questions or concerns at this time. Patient reports wanting a shower prior to initiation of pitocin at this time.   Objective: BP 132/74    Pulse 96    Temp 98.2 F (36.8 C) (Oral)    Resp 16    Ht 5\' 6"  (1.676 m)    Wt 117.8 kg    LMP 04/09/2020    SpO2 99%    BMI 41.90 kg/m  No intake/output data recorded. No intake/output data recorded.  FHT:  FHR: 125 bpm, variability: moderate,  accelerations:  Present,  decelerations:  Absent UC:   regular, every 2-3 minutes SVE:   Dilation: 3 Effacement (%): 50 Station: -2 Exam by:: Gildardo Pounds, MD  Labs: Lab Results  Component Value Date   WBC 9.5 01/29/2021   HGB 10.6 (L) 01/29/2021   HCT 31.3 (L) 01/29/2021   MCV 91.3 01/29/2021   PLT 231 01/29/2021    Assessment / Plan: Induction of labor due to gestational hypertension  Labor: Progressing normally, will start pitocin at this time  Gestational HTN: Continue to monitor BPs Fetal Wellbeing:  Category I Pain Control:  IV pain meds I/D:  n/a Anticipated MOD:  NSVD  Fabiola Backer 01/29/2021, 11:16 PM

## 2021-01-29 NOTE — H&P (Addendum)
OB ADMISSION/ HISTORY & PHYSICAL:  Admission Date: 01/29/2021 12:48 PM  Admit Diagnosis: Gestational hypertension, third trimester [O13.3]    Melissa Conway is a 21 y.o. female at [redacted]w[redacted]d presenting for IOL for gHTN. Patient reports fetal movement and denies vaginal bleeding and leaking of fluid. Patient denies RUQ pain, headache, and vision changes. Significant other Melissa Conway at bedside and supportive. Expecting baby boy, unknown name.   Prenatal History: G1P0   EDC : 02/18/2021, by Ultrasound  Prenatal care at: [facility]  Prenatal course complicated by: Gestational HTN Anxiety/ Depression Crohn disease Obesity in pregnancy Anemia in pregnancy   Prenatal Labs:      Nursing Staff Provider  Office Location  KV Dating   LMP  Language   English Anatomy US   normal  Flu Vaccine   10/02/20 Genetic/Carrier Screen  NIPS:   NML female AFP:  Neg  Horizon:Neg  TDaP Vaccine   11/30/20 Hgb A1C or  GTT Early  Third trimester nml  COVID Vaccine  yes, completed   LAB RESULTS   Rhogam   n/a Blood Type O/RH(D) POSITIVE/-- (07/11 1018)   Baby Feeding Plan  breast Antibody NO ANTIBODIES DETECTED (07/11 1018)  Contraception  ?IUD Rubella 3.87 (07/11 1018)  Circumcision  no RPR NON-REACTIVE (07/11 1018)   Pediatrician   undecided HBsAg NON-REACTIVE (07/11 1018)   Support Person  Melissa Conway HCVAb NEG  Prenatal Classes   HIV NON-REACTIVE (07/11 1018)     BTL Consent   GBS   (For PCN allergy, check sensitivities)   VBAC Consent   Pap 7/22           DME Rx [ ]  BP cuff [ ]  Weight Scale Doren Custard  [x ] Class [ ]  Consent [x]  CNM visit  PHQ9 & GAD7 [x]  new OB [ x ] 28 weeks  [  ] 36 weeks Induction  [ ]  Orders Entered [ ] Foley Y/N    Medical / Surgical History : Past medical history:  Past Medical History:  Diagnosis Date   Crohn disease ([place])    Crohn's colitis ([place])    Depression     Past surgical history:  Past Surgical History:  Procedure Laterality Date   ADENOIDECTOMY     CHOLECYSTECTOMY      TONSILLECTOMY     TYMPANOSTOMY TUBE PLACEMENT      Family History:  Family History  Problem Relation Age of Onset   Diabetes Mother    Depression Mother    OCD Mother    Depression Father    Melanoma Maternal Grandmother    Depression Maternal Grandmother    Anxiety disorder Maternal Grandmother    Colon cancer Maternal Grandfather    Diabetes Maternal Grandfather    Diabetes Paternal Grandfather    Heart disease Maternal Aunt    Non-Hodgkin's lymphoma Maternal Uncle     Social History:  reports that she has never smoked. She has never used smokeless tobacco. She reports that she does not drink alcohol and does not use drugs. Allergies: Vancomycin, Penicillin g, and Penicillins   Current Medications at time of admission:  Medications Prior to Admission  Medication Sig Dispense Refill Last Dose   HUMIRA PEN 40 MG/0.4ML PNKT SMARTSIG:40 Milligram(s) SUB-Q Every 2 Weeks   Past Week   Multiple Vitamin (MULTIVITAMIN) tablet Take 1 tablet by mouth daily.   01/28/2021   Prenatal Vit-Fe Fumarate-FA (PRENATAL MULTIVITAMIN) TABS tablet Take 1 tablet by mouth daily at 12 noon.   01/28/2021   cyclobenzaprine (FLEXERIL) 10  MG tablet Take 1 tablet (10 mg total) by mouth 2 (two) times daily as needed for muscle spasms. 20 tablet 0 Unknown   polyethylene glycol (MIRALAX / GLYCOLAX) 17 g packet Take 17 g by mouth daily.   More than a month     Review of Systems: Review of Systems  Constitutional: Negative.   HENT: Negative.    Respiratory: Negative.    Cardiovascular: Negative.   Gastrointestinal: Negative.   Genitourinary: Negative.   Neurological: Negative.   Psychiatric/Behavioral: Negative.     Physical Exam: Vital signs and nursing notes reviewed.  Patient Vitals for the past 24 hrs:  BP Temp Pulse Resp SpO2 Height Weight  01/29/21 1322 -- (!) 97.4 F (36.3 C) -- 18 99 % 5\' 6"  (1.676 m) 259 lb 9.6 oz (117.8 kg)  01/29/21 1300 130/80 -- (!) 112 -- -- -- --     General: AAO x 3,  NAD Heart: RRR Lungs:CTAB Abdomen: Gravid, NT Extremities: no edema Genitalia / VE: Dilation: 1 Effacement (%): 60 Station: -3 Presentation: Vertex Exam by:: [employer], SNM   FHR: 135 BPM, moderate variability, + accels, no decels TOCO: Rare contractions   Labs:   Pending T&S, CBC, RPR  Recent Labs    01/29/21 0954 01/29/21 1311  WBC 9.6 9.5  HGB 10.6* 10.6*  HCT 31.7* 31.3*  PLT 210 231    Assessment:  21 y.o. G1P0 at [redacted]w[redacted]d IOL gHTN.   1. 1st stage of labor 2. FHR category 1 3. GBS negative 4. Desires epidural during labor 5. Plans to breastfeed  Plan:  1. Admit to BS 2. Routine L&D orders 3. Analgesia/anesthesia PRN 3. Cytotec x1 for cervical ripening   4. Foley balloon placement unsuccessful. Will possibly retry at next cervical exam in 4 hours 5. Anticipate NSVB  Christiane Ha, SNM 01/29/2021, 2:04 PM  Attestation of Supervision of Student:  I confirm that I have verified the information documented in the nurse midwife students note and that I have also personally reperformed the history, physical exam and all medical decision making activities.  I have verified that all services and findings are accurately documented in this student's note; and I agree with management and plan as outlined in the documentation. I have also made any necessary editorial changes.  --21 y.o. G1P0 at [redacted]w[redacted]d  --GHTN, P:Cr collected in office, run as stat, asymptomatic --Cat I tracing --Cytotec # 1 placed at 1331, assess for foley balloon attempt with Cytotec #2 in four hours --Anticipate vaginal birth  Darlina Rumpf, [place] for [employer], [place] 01/29/2021 2:21 PM

## 2021-01-29 NOTE — Progress Notes (Signed)
° °  PRENATAL VISIT NOTE  Subjective:  Melissa Conway is a 21 y.o. G1P0 at [redacted]w[redacted]d being seen today for ongoing prenatal care.  She is currently monitored for the following issues for this high-risk pregnancy and has Supervision of low-risk first pregnancy; Crohn disease ([place]); Psoriasis; Anxiety and depression; Obesity in pregnancy; and Anemia in pregnancy on their problem list.  Patient reports no complaints.  Contractions: Not present. Vag. Bleeding: None.  Movement: Present. Denies leaking of fluid.   The following portions of the patient's history were reviewed and updated as appropriate: allergies, current medications, past family history, past medical history, past social history, past surgical history and problem list.   Objective:   Vitals:   01/29/21 0919  BP: (!) 141/72  Pulse: (!) 104    Fetal Status: Fetal Heart Rate (bpm): NST   Movement: Present     General:  Alert, oriented and cooperative. Patient is in no acute distress.  Skin: Skin is warm and dry. No rash noted.   Cardiovascular: Normal heart rate noted  Respiratory: Normal respiratory effort, no problems with respiration noted  Abdomen: Soft, gravid, appropriate for gestational age.  Pain/Pressure: Absent     Pelvic: Cervical exam deferred        Extremities: Normal range of motion.  Edema: Trace  Mental Status: Normal mood and affect. Normal behavior. Normal judgment and thought content.   Assessment and Plan:  Pregnancy: G1P0 at [redacted]w[redacted]d  1. Encounter for supervision of low-risk first pregnancy in third trimester  - CBC - Comprehensive metabolic panel - Protein / creatinine ratio, urine  2. Gestational hypertension, third trimester  First elevated BP on 12/16: 142/73 BP 1/12: 139/75 Today 141/72 Patient meets criteria for GHTN@ 37 weeks. She is asymptomatic. Discussed patient with labor team. Plan for induction today as there are no induction slots available today. She is a direct admit.  NST: reactive,  Cat 2, 130 baseline, 15x15 accels, no decels.     Term labor symptoms and general obstetric precautions including but not limited to vaginal bleeding, contractions, leaking of fluid and fetal movement were reviewed in detail with the patient. Please refer to After Visit Summary for other counseling recommendations.   No follow-ups on file.  Future Appointments  Date Time Provider [place]  01/29/2021  9:30 AM [facility] NURSE [facility]  01/29/2021  9:50 AM Akayla Brass, Artist Pais, NP [facility]  02/01/2021  9:00 AM [place] [facility]  02/05/2021  9:30 AM [facility] NURSE [facility]  02/05/2021 10:10 AM [place], Kathie Dike, CNM [facility]  02/08/2021  9:00 AM [place] [facility]  02/12/2021  8:50 AM Tiphany Fayson, Artist Pais, NP [facility]  02/12/2021  9:30 AM [place] [facility]  02/15/2021  8:50 AM [facility] NURSE [facility]  02/19/2021  8:50 AM Leftwich-Kirby, Kathie Dike, CNM [facility]  02/19/2021  9:30 AM [facility] NURSE [facility]  02/22/2021  9:00 AM [facility] NURSE [facility]    Noni Saupe, NP

## 2021-01-29 NOTE — Progress Notes (Signed)
BP 141/72. Pt denies headache and/or visual changes.

## 2021-01-30 ENCOUNTER — Inpatient Hospital Stay (HOSPITAL_COMMUNITY): Payer: 59 | Admitting: Anesthesiology

## 2021-01-30 LAB — PROTEIN / CREATININE RATIO, URINE
Creatinine, Urine: 95 mg/dL (ref 20–275)
Protein/Creat Ratio: 147 mg/g creat (ref 24–184)
Protein/Creatinine Ratio: 0.147 mg/mg creat (ref 0.024–0.184)
Total Protein, Urine: 14 mg/dL (ref 5–24)

## 2021-01-30 LAB — COMPREHENSIVE METABOLIC PANEL
AG Ratio: 1 (calc) (ref 1.0–2.5)
ALT: 11 U/L (ref 6–29)
AST: 15 U/L (ref 10–30)
Albumin: 3.5 g/dL — ABNORMAL LOW (ref 3.6–5.1)
Alkaline phosphatase (APISO): 142 U/L — ABNORMAL HIGH (ref 31–125)
BUN: 10 mg/dL (ref 7–25)
CO2: 27 mmol/L (ref 20–32)
Calcium: 8.5 mg/dL — ABNORMAL LOW (ref 8.6–10.2)
Chloride: 104 mmol/L (ref 98–110)
Creat: 0.57 mg/dL (ref 0.50–0.96)
Globulin: 3.4 g/dL (calc) (ref 1.9–3.7)
Glucose, Bld: 85 mg/dL (ref 65–99)
Potassium: 4 mmol/L (ref 3.5–5.3)
Sodium: 137 mmol/L (ref 135–146)
Total Bilirubin: 0.3 mg/dL (ref 0.2–1.2)
Total Protein: 6.9 g/dL (ref 6.1–8.1)

## 2021-01-30 LAB — CBC
HCT: 31.7 % — ABNORMAL LOW (ref 35.0–45.0)
HCT: 30.4 % — ABNORMAL LOW (ref 36.0–46.0)
Hemoglobin: 10.6 g/dL — ABNORMAL LOW (ref 11.7–15.5)
Hemoglobin: 10.3 g/dL — ABNORMAL LOW (ref 12.0–15.0)
MCH: 30.9 pg (ref 27.0–33.0)
MCH: 30.9 pg (ref 26.0–34.0)
MCHC: 33.4 g/dL (ref 32.0–36.0)
MCHC: 33.9 g/dL (ref 30.0–36.0)
MCV: 92.4 fL (ref 80.0–100.0)
MCV: 91.3 fL (ref 80.0–100.0)
MPV: 11.6 fL (ref 7.5–12.5)
Platelets: 210 10*3/uL (ref 140–400)
Platelets: 215 10*3/uL (ref 150–400)
RBC: 3.43 10*6/uL — ABNORMAL LOW (ref 3.80–5.10)
RBC: 3.33 MIL/uL — ABNORMAL LOW (ref 3.87–5.11)
RDW: 13 % (ref 11.0–15.0)
RDW: 13.3 % (ref 11.5–15.5)
WBC: 9.6 10*3/uL (ref 3.8–10.8)
WBC: 9 10*3/uL (ref 4.0–10.5)
nRBC: 0 % (ref 0.0–0.2)

## 2021-01-30 LAB — RPR: RPR Ser Ql: NONREACTIVE

## 2021-01-30 MED ORDER — BUPIVACAINE HCL (PF) 0.25 % IJ SOLN
INTRAMUSCULAR | Status: DC | PRN
  Administered 2021-01-30 (×2): 3 mL via EPIDURAL

## 2021-01-30 MED ORDER — PHENYLEPHRINE 40 MCG/ML (10ML) SYRINGE FOR IV PUSH (FOR BLOOD PRESSURE SUPPORT): 80.0000 ug | PREFILLED_SYRINGE | INTRAVENOUS | Status: DC | PRN

## 2021-01-30 MED ORDER — EPHEDRINE 5 MG/ML INJ: 10.0000 mg | INTRAVENOUS | Status: DC | PRN

## 2021-01-30 MED ORDER — FENTANYL CITRATE (PF) 100 MCG/2ML IJ SOLN
100.0000 ug | INTRAMUSCULAR | Status: DC | PRN
  Administered 2021-01-30 (×3): 100 ug via INTRAVENOUS
  Filled 2021-01-30 (×3): qty 2

## 2021-01-30 MED ORDER — SODIUM BICARBONATE 8.4 % IV SOLN
INTRAVENOUS | Status: DC | PRN
  Administered 2021-01-30: 3 mL via EPIDURAL

## 2021-01-30 MED ORDER — FENTANYL-BUPIVACAINE-NACL 0.5-0.125-0.9 MG/250ML-% EP SOLN
12.0000 mL/h | EPIDURAL | Status: DC | PRN
  Administered 2021-01-30: 12 mL/h via EPIDURAL
  Filled 2021-01-30: qty 250

## 2021-01-30 MED ORDER — LACTATED RINGERS IV SOLN: 500.0000 mL | Freq: Once | INTRAVENOUS | Status: DC

## 2021-01-30 MED ORDER — HYDROXYZINE HCL 25 MG PO TABS
25.0000 mg | ORAL_TABLET | Freq: Four times a day (QID) | ORAL | Status: DC | PRN
  Administered 2021-01-30: 25 mg via ORAL
  Filled 2021-01-30: qty 1

## 2021-01-30 MED ORDER — DIPHENHYDRAMINE HCL 50 MG/ML IJ SOLN: 12.5000 mg | INTRAMUSCULAR | Status: DC | PRN

## 2021-01-30 NOTE — Progress Notes (Signed)
Labor Progress Note Melissa Conway is a 21 y.o. G1P0 at [redacted]w[redacted]d presented for IOL due to gHTN.   Feeling some of the contractions. A little anxious and hopeful to get a nap.   O:  BP (!) 116/58    Pulse 79    Temp 98.4 F (36.9 C)    Resp 16    Ht 5\' 6"  (1.676 m)    Wt 117.8 kg    LMP 04/09/2020    SpO2 99%    BMI 41.90 kg/m  EFM: 130/mod/15x15/none  CVE: Dilation: 3 Effacement (%): 50 Station: -3 Presentation: Vertex Exam by:: [email or web address], RN   A&P: 21 y.o. G1P0 [redacted]w[redacted]d  #Labor: Checked by RN with minimal progression since starting pit, not having consistent contractions quite yet. Hopeful she will get into a better pattern and plan for AROM when cervical station appropriate.  #Pain: PRN  #FWB: Cat I  #GBS negative  #gHTN: Normotensive currently. Cont to monitor.   #Contraception: Discussed earlier this evening. She would like a post-placental IUD if she gets an epidural. If not, she would like to postpone until her postpartum visit. Discussed effectiveness and risk of expulsion. Will have consent signed.   [redacted]w[redacted]d, DO 5:56 AM

## 2021-01-30 NOTE — Progress Notes (Signed)
Melissa Conway is a 21 y.o. G1P0 at [redacted]w[redacted]d by ultrasound admitted for induction of labor due to [facility].  Subjective: Doing well at this time, states that she has become more comfortable throughout the day as she has gotten her epidural. FOB at bedside for support. No questions or concerns at this time.   Objective: BP 124/81    Pulse 89    Temp 98.3 F (36.8 C) (Oral)    Resp 18    Ht 5\' 6"  (1.676 m)    Wt 117.8 kg    LMP 04/09/2020    SpO2 97%    BMI 41.90 kg/m  No intake/output data recorded. No intake/output data recorded.  FHT:  FHR: 135 bpm, variability: moderate,  accelerations:  Present,  decelerations:  Present early/variable  UC:   regular, every 2-3 minutes SVE:   Dilation: 9 Effacement (%): 90 Station: -2 Exam by:: Thersa Salt, RN  Labs: Lab Results  Component Value Date   WBC 9.0 01/30/2021   HGB 10.3 (L) 01/30/2021   HCT 30.4 (L) 01/30/2021   MCV 91.3 01/30/2021   PLT 215 01/30/2021    Assessment / Plan: Induction of labor due to gestational hypertension,  progressing well on pitocin  Labor:  Progressing well on pitocin Gestational HTN:  no signs or symptoms of preE, BP remains normotensive at this time Fetal Wellbeing:  Category I Pain Control:  Epidural I/D:  n/a Anticipated MOD:  NSVD  Fabiola Backer, MD 01/30/2021, 8:49 PM

## 2021-01-30 NOTE — Anesthesia Procedure Notes (Signed)
Epidural Patient location during procedure: OB Start time: 01/30/2021 6:20 PM End time: 01/30/2021 6:37 PM  Staffing Anesthesiologist: Val Eagle, MD Performed: anesthesiologist   Preanesthetic Checklist Completed: patient identified, IV checked, risks and benefits discussed, surgical consent, monitors and equipment checked, pre-op evaluation and timeout performed  Epidural Patient position: sitting Prep: DuraPrep Patient monitoring: heart rate, continuous pulse ox and blood pressure Approach: midline Location: L4-L5 Injection technique: LOR saline  Needle:  Needle type: Tuohy  Needle gauge: 17 G Needle length: 9 cm Needle insertion depth: 8 cm Catheter type: closed end flexible Catheter size: 19 Gauge Catheter at skin depth: 15 cm Test dose: 2% lidocaine with Epi 1:200 K  Assessment Events: blood not aspirated, injection not painful, no injection resistance, no paresthesia and negative IV test  Additional Notes Reason for block:procedure for pain

## 2021-01-30 NOTE — Progress Notes (Signed)
Patient ID: Melissa Conway, female   DOB: 12/10/2000, 21 y.o.   MRN: [id number] Sitting up in bed Vitals:   01/30/21 1901 01/30/21 1930 01/30/21 2000 01/30/21 2030  BP: 131/77 115/62 114/67 127/72  Pulse: 96 (!) 116 100 100  Resp: 17 18 18 18   Temp: 97.9 F (36.6 C) 98 F (36.7 C)    TempSrc: Oral     SpO2: 97%     Weight:      Height:       FHR reassuring Had a few variable decels earlier which have resolved  Cervix reported to be Dilation: 9 Effacement (%): 90 Station: -2 Presentation: Vertex Exam by:: [email or web address], RN  Will observe for now and recheck

## 2021-01-30 NOTE — Progress Notes (Signed)
Melissa Conway is a 21 y.o. G1P0 at [redacted]w[redacted]d  admitted for induction of labor due to [facility]. Started on 1/17 (yesterday)  Subjective: Reports starting to feel some discomfort. Nitrous is helping some but would like to try IV pain medication instead.  Objective: BP 131/72    Pulse 91    Temp 98.1 F (36.7 C)    Resp 16    Ht 5\' 6"  (1.676 m)    Wt 117.8 kg    LMP 04/09/2020    SpO2 98%    BMI 41.90 kg/m  No intake/output data recorded. No intake/output data recorded.  FHT:  FHR: 130 bpm, variability: moderate,  accelerations:  Present,  decelerations:  Absent UC:   every 1-3 minutes SVE:   Dilation: 3.5 Effacement (%): 50 Station: -3 Exam by:: Dr. Cy Blamer  Labs: Lab Results  Component Value Date   WBC 9.5 01/29/2021   HGB 10.6 (L) 01/29/2021   HCT 31.3 (L) 01/29/2021   MCV 91.3 01/29/2021   PLT 231 01/29/2021    Assessment / Plan: [redacted]w[redacted]d  admitted for induction of labor due to gHTN. Started on 1/17 (yesterday)  Labor:  s/p Cytotec x2 and started on pitocin at 2330 on 1/17. Now on pitocin for ~12 hrs. Head continues to be ballotable on check. Starting to feel contractions more and more uncomfortable. Discussed positioning (including walking, peanut ball, and birthing ball) to help descend head more in cervix. Will assess in 2-3 hours for appropriateness of AROM  Fetal Wellbeing:  Category I Pain Control:  IV pain meds I/D:   GBS neg   #gHTN BP normotensive to mild range while here for IOL. Admission preE labs WNL. Will continue to monitor  Renard Matter 01/30/2021, 11:56 AM

## 2021-01-30 NOTE — Progress Notes (Signed)
Melissa Conway is a 21 y.o. G1P0 at [redacted]w[redacted]d admitted for induction of labor due to [facility].  Subjective: Feeling contractions. Overall coping ok. Rates at 4/10.  Objective: BP 124/63    Pulse 88    Temp 97.9 F (36.6 C) (Oral)    Resp 16    Ht 5\' 6"  (1.676 m)    Wt 117.8 kg    LMP 04/09/2020    SpO2 98%    BMI 41.90 kg/m  No intake/output data recorded. No intake/output data recorded.  FHT:  FHR: 135 bpm, variability: moderate,  accelerations:  Present,  decelerations:  Absent UC:   regular, every 2-3 minutes SVE:   Dilation: 4 Effacement (%): 50 Station: -2, -3 Exam by:: Dr. Cy Blamer  Labs: Lab Results  Component Value Date   WBC 9.5 01/29/2021   HGB 10.6 (L) 01/29/2021   HCT 31.3 (L) 01/29/2021   MCV 91.3 01/29/2021   PLT 231 01/29/2021    Assessment / Plan: G1P0 at [redacted]w[redacted]d admitted for induction of labor due to [place].  Labor:  on pitocin. Cervix now ~4 cm, still with some thickness. Head well applied and given on pitocin for >12 hrs AROMed during current check. Tolerated well by fetus and patient. IUPC also placed for improved contraction monitoring and to aid in pitocin uptitration  Will continue on pitocin and uptitrated as warranted and tolerated. gHTN:  no signs or symptoms of toxicity, BP normotensive  Fetal Wellbeing:  Category I Pain Control:  IV pain meds I/D:   GBS neg   Renard Matter 01/30/2021, 4:20 PM

## 2021-01-30 NOTE — Anesthesia Preprocedure Evaluation (Signed)
Anesthesia Evaluation  Patient identified by MRN, date of birth, ID band Patient awake    Reviewed: Allergy & Precautions, Patient's Chart, lab work & pertinent test results  History of Anesthesia Complications Negative for: history of anesthetic complications  Airway Mallampati: III  TM Distance: >3 FB Neck ROM: Full    Dental  (+) Dental Advisory Given, Teeth Intact   Pulmonary neg pulmonary ROS,    breath sounds clear to auscultation       Cardiovascular hypertension,  Rhythm:Regular  gestational HTN   Neuro/Psych negative neurological ROS     GI/Hepatic Neg liver ROS, Crohns   Endo/Other  negative endocrine ROS  Renal/GU negative Renal ROS     Musculoskeletal negative musculoskeletal ROS (+)   Abdominal   Peds  Hematology  (+) Blood dyscrasia, anemia , Lab Results      Component                Value               Date                      WBC                      9.0                 01/30/2021                HGB                      10.3 (L)            01/30/2021                HCT                      30.4 (L)            01/30/2021                MCV                      91.3                01/30/2021                PLT                      215                 01/30/2021           Denies blood thinners   Anesthesia Other Findings   Reproductive/Obstetrics (+) Pregnancy                             Anesthesia Physical Anesthesia Plan  ASA: 2  Anesthesia Plan: Epidural   Post-op Pain Management:    Induction:   PONV Risk Score and Plan: 2 and Treatment may vary due to age or medical condition  Airway Management Planned: Natural Airway  Additional Equipment: None  Intra-op Plan:   Post-operative Plan:   Informed Consent: I have reviewed the patients History and Physical, chart, labs and discussed the procedure including the risks, benefits and alternatives for the  proposed anesthesia with the patient or authorized representative who has indicated his/her understanding and acceptance.  Plan Discussed with: Anesthesiologist  Anesthesia Plan Comments:         Anesthesia Quick Evaluation

## 2021-01-31 ENCOUNTER — Encounter (HOSPITAL_COMMUNITY): Payer: Self-pay | Admitting: Family Medicine

## 2021-01-31 DIAGNOSIS — O134 Gestational [pregnancy-induced] hypertension without significant proteinuria, complicating childbirth: Secondary | ICD-10-CM | POA: Diagnosis not present

## 2021-01-31 DIAGNOSIS — Z3A37 37 weeks gestation of pregnancy: Secondary | ICD-10-CM

## 2021-01-31 LAB — CBC
HCT: 28.6 % — ABNORMAL LOW (ref 36.0–46.0)
HCT: 26 % — ABNORMAL LOW (ref 36.0–46.0)
Hemoglobin: 9.5 g/dL — ABNORMAL LOW (ref 12.0–15.0)
Hemoglobin: 8.5 g/dL — ABNORMAL LOW (ref 12.0–15.0)
MCH: 30.7 pg (ref 26.0–34.0)
MCH: 30 pg (ref 26.0–34.0)
MCHC: 33.2 g/dL (ref 30.0–36.0)
MCHC: 32.7 g/dL (ref 30.0–36.0)
MCV: 92.6 fL (ref 80.0–100.0)
MCV: 91.9 fL (ref 80.0–100.0)
Platelets: 216 10*3/uL (ref 150–400)
Platelets: 192 10*3/uL (ref 150–400)
RBC: 3.09 MIL/uL — ABNORMAL LOW (ref 3.87–5.11)
RBC: 2.83 MIL/uL — ABNORMAL LOW (ref 3.87–5.11)
RDW: 13.4 % (ref 11.5–15.5)
RDW: 13.2 % (ref 11.5–15.5)
WBC: 12.2 10*3/uL — ABNORMAL HIGH (ref 4.0–10.5)
WBC: 11.5 10*3/uL — ABNORMAL HIGH (ref 4.0–10.5)
nRBC: 0 % (ref 0.0–0.2)
nRBC: 0 % (ref 0.0–0.2)

## 2021-01-31 MED ORDER — ZOLPIDEM TARTRATE 5 MG PO TABS: 5.0000 mg | ORAL_TABLET | Freq: Every evening | ORAL | Status: DC | PRN

## 2021-01-31 MED ORDER — FUROSEMIDE 20 MG PO TABS
20.0000 mg | ORAL_TABLET | Freq: Every day | ORAL | Status: DC
  Administered 2021-02-01: 20 mg via ORAL
  Filled 2021-01-31: qty 1

## 2021-01-31 MED ORDER — DIPHENHYDRAMINE HCL 25 MG PO CAPS: 25.0000 mg | ORAL_CAPSULE | Freq: Four times a day (QID) | ORAL | Status: DC | PRN

## 2021-01-31 MED ORDER — IBUPROFEN 600 MG PO TABS
600.0000 mg | ORAL_TABLET | Freq: Four times a day (QID) | ORAL | Status: DC
  Administered 2021-01-31 – 2021-02-01 (×7): 600 mg via ORAL
  Filled 2021-01-31 (×7): qty 1

## 2021-01-31 MED ORDER — WITCH HAZEL-GLYCERIN EX PADS: 1.0000 "application " | MEDICATED_PAD | CUTANEOUS | Status: DC | PRN

## 2021-01-31 MED ORDER — PRENATAL MULTIVITAMIN CH
1.0000 | ORAL_TABLET | Freq: Every day | ORAL | Status: DC
  Administered 2021-01-31 – 2021-02-01 (×2): 1 via ORAL
  Filled 2021-01-31 (×2): qty 1

## 2021-01-31 MED ORDER — SIMETHICONE 80 MG PO CHEW: 80.0000 mg | CHEWABLE_TABLET | ORAL | Status: DC | PRN

## 2021-01-31 MED ORDER — COCONUT OIL OIL: 1.0000 "application " | TOPICAL_OIL | Status: DC | PRN

## 2021-01-31 MED ORDER — TETANUS-DIPHTH-ACELL PERTUSSIS 5-2.5-18.5 LF-MCG/0.5 IM SUSY: 0.5000 mL | PREFILLED_SYRINGE | Freq: Once | INTRAMUSCULAR | Status: DC

## 2021-01-31 MED ORDER — ONDANSETRON HCL 4 MG/2ML IJ SOLN: 4.0000 mg | INTRAMUSCULAR | Status: DC | PRN

## 2021-01-31 MED ORDER — ONDANSETRON HCL 4 MG PO TABS: 4.0000 mg | ORAL_TABLET | ORAL | Status: DC | PRN

## 2021-01-31 MED ORDER — SENNOSIDES-DOCUSATE SODIUM 8.6-50 MG PO TABS
2.0000 | ORAL_TABLET | Freq: Every day | ORAL | Status: DC
  Administered 2021-02-01: 2 via ORAL
  Filled 2021-01-31: qty 2

## 2021-01-31 MED ORDER — MEASLES, MUMPS & RUBELLA VAC IJ SOLR: 0.5000 mL | Freq: Once | INTRAMUSCULAR | Status: DC

## 2021-01-31 MED ORDER — ACETAMINOPHEN 325 MG PO TABS: 650.0000 mg | ORAL_TABLET | ORAL | Status: DC | PRN

## 2021-01-31 MED ORDER — MISOPROSTOL 200 MCG PO TABS: 600.0000 ug | ORAL_TABLET | Freq: Once | ORAL | Status: AC

## 2021-01-31 MED ORDER — BENZOCAINE-MENTHOL 20-0.5 % EX AERO: 1.0000 "application " | INHALATION_SPRAY | CUTANEOUS | Status: DC | PRN

## 2021-01-31 MED ORDER — MISOPROSTOL 200 MCG PO TABS
ORAL_TABLET | ORAL | Status: AC
  Administered 2021-01-31: 600 ug via ORAL
  Filled 2021-01-31: qty 3

## 2021-01-31 MED ORDER — DIBUCAINE (PERIANAL) 1 % EX OINT: 1.0000 "application " | TOPICAL_OINTMENT | CUTANEOUS | Status: DC | PRN

## 2021-01-31 NOTE — Progress Notes (Signed)
Patient ID: Melissa Conway, female   DOB: 2000-07-16, 21 y.o.   MRN: [id number] Completely numb, does not feel any pressure  Vitals:   01/30/21 2100 01/30/21 2130 01/30/21 2200 01/30/21 2230  BP: 134/76 119/61 124/81 126/69  Pulse: (!) 117 79 89 89  Resp: 18 18 18 18   Temp:  98.3 F (36.8 C)    TempSrc:  Oral    SpO2:      Weight:      Height:       FHR reassuring UCs q 4min  Dilation: 10 Dilation Complete Date: 01/31/21 Dilation Complete Time: 0025 Effacement (%): 100 Station: Plus 2 Presentation: Vertex Exam by:: Gildardo Pounds, MD  Will start pushing

## 2021-01-31 NOTE — Lactation Note (Signed)
This note was copied from a baby's chart. Lactation Consultation Note  Patient Name: Melissa Conway [id number] Date: 01/31/2021 Reason for consult: L&D Initial assessment Age:23 hours, P1, ETI female infant. LC entered the room, mom was doing skin to skin with infant. Mom latched infant on her left breast using the football hold position, infant was on and off breast for 12 minutes. Mom was doing skin to skin with infant when [facility] left the room.  Mom knows to ask RN/LC for further latch assistance if needed on MBU. Mom will continue to breastfeed infant according primal cues, skin to skin. Maternal Data    Feeding Mother's Current Feeding Choice: Breast Milk  LATCH Score Latch: Repeated attempts needed to sustain latch, nipple held in mouth throughout feeding, stimulation needed to elicit sucking reflex.  Audible Swallowing: A few with stimulation  Type of Nipple: Everted at rest and after stimulation  Comfort (Breast/Nipple): Soft / non-tender  Hold (Positioning): Assistance needed to correctly position infant at breast and maintain latch.  LATCH Score: 7   Lactation Tools Discussed/Used    Interventions Interventions: Assisted with latch;Skin to skin;Breast compression;Adjust position;Support pillows;Position options;Education  Discharge    Consult Status Consult Status: Follow-up Date: 01/31/21 Follow-up type: In-patient    Danelle Earthly 01/31/2021, 2:24 AM

## 2021-01-31 NOTE — Social Work (Signed)
MOB was referred for history of depression and anxiety.  ° °* Referral screened out by Clinical Social Worker because none of the following criteria appear to apply: ° °~ History of anxiety/depression during this pregnancy, or of post-partum depression following prior delivery. °~ Diagnosis of anxiety and/or depression within last 3 years. °OR °* MOB's symptoms currently being treated with medication and/or therapy. CSW reviewed chart and notes MOB currently prescribed Zoloft.  ° °MOB scored a 5 on the Edinburgh Postpartum Depression Scale. ° °Please contact the Clinical Social Worker if needs arise, by MOB request, or if MOB scores greater than 9/yes to question 10 on Edinburgh Postpartum Depression Screen. ° °Kadence Mikkelson, LCSWA °Clinical Social Work °[facility]  °[phone]  °

## 2021-01-31 NOTE — Lactation Note (Signed)
This note was copied from a baby's chart. Lactation Consultation Note  Patient Name: Melissa Conway Agent [id number] Date: 01/31/2021 Reason for consult: Mother's request;Follow-up assessment;1st time breastfeeding;Early term 37-38.6wks Age:23 hours Per mom, earlier today infant was sleepy and not latching well at the breast , she was doing hand expression. Infant has started back latching well  since 1600 pm, infant is  now breastfeeding 10 to 15 minutes per feedings , last feeding was at 2135 pm for 12 minutes. Mom has been feeding infant skin to skin, doing breast compressions and gently stroking infant's neck and shoulder to keep infant awake while breastfeeding.  LC did not see latch at this time, infant was asleep in basinet and had last breastfeed  less than 1 hour 30 minutes. Mom would like hand pump, LC explained how to use and mom was expressing colostrum in breast flange, mom knows she can finger feed to infant after latching infant at the breast at next feeding. Mom knows expressed breast milk is safe up to 4 hours at room temperature. Mom's plan : 1- Mom will continue to breastfeeding infant according to hunger cues, skin to skin and attempt to breastfeed infant on both breast during a feeding. 2- Mom knows to call RN/LC if she need further assistance with latching infant at the breast. 3- Mom knows she can give infant back EBM by hand expressing or pumping if infant is not latching well at the breast.   Maternal Data    Feeding Mother's Current Feeding Choice: Breast Milk  LATCH Score                    Lactation Tools Discussed/Used Tools: Pump Breast pump type: Manual Pump Education: Setup, frequency, and cleaning;Milk Storage Reason for Pumping: request and infant is ETI, infant been little sleepy earlier today and mom has been doing some hand expression Pumping frequency: Mom will use at her discretion Pumped volume: 1 mL  Interventions Interventions: Skin  to skin;Breast compression;Education;Hand pump  Discharge    Consult Status Consult Status: Follow-up Date: 02/01/21 Follow-up type: In-patient    Danelle Earthly 01/31/2021, 10:48 PM

## 2021-01-31 NOTE — Discharge Summary (Signed)
Postpartum Discharge Summary      Patient Name: Melissa Conway DOB: October 29, 2000 MRN: [id number]  Date of admission: 01/29/2021 Delivery date:01/31/2021  Delivering provider: Seabron Spates  Date of discharge: 02/01/2021  Admitting diagnosis: Gestational hypertension, third trimester [O13.3] Intrauterine pregnancy: [redacted]w[redacted]d    Secondary diagnosis:  Principal Problem:   Gestational hypertension, third trimester  Additional problems: none    Discharge diagnosis: Term Pregnancy Delivered and Gestational Hypertension                                              Post partum procedures: None Augmentation: Cytotec and IP Foley  Pitocin, AROM Complications: None  Hospital course: Induction of Labor With Vaginal Delivery   21y.o. yo G1P1001 at 322w3das admitted to the hospital 01/29/2021 for induction of labor.  Indication for induction: Gestational hypertension.  Patient had an uncomplicated labor course as follows: Membrane Rupture Time/Date: 4:02 PM ,01/30/2021   Delivery Method:Vaginal, Spontaneous  Episiotomy: None  Lacerations:  2nd degree;Perineal  Details of delivery can be found in separate delivery note.  Patient had a routine postpartum course. Patient is discharged home 02/01/21.  Newborn Data: Birth date:01/31/2021  Birth time:1:09 AM  Gender:Female  Living status:Living  Apgars:9 ,9  Weight:3000 g   Magnesium Sulfate received: No BMZ received: No Rhophylac:N/A MMR:No T-DaP: no Flu: No Transfusion:No  Physical exam  Vitals:   01/31/21 1200 01/31/21 1615 01/31/21 2257 02/01/21 0555  BP: 123/72 125/65 121/63 131/74  Pulse: 94 (!) 108 (!) 102 99  Resp: 18 16 16 18   Temp: 98.3 F (36.8 C) 98.2 F (36.8 C) 98.6 F (37 C) 98.2 F (36.8 C)  TempSrc: Oral Oral  Oral  SpO2: 97% 96%    Weight:      Height:       General: alert Lochia: appropriate Uterine Fundus: firm Incision: N/A DVT Evaluation: No evidence of DVT seen on physical exam. Labs: Lab Results   Component Value Date   WBC 11.5 (H) 01/31/2021   HGB 8.5 (L) 01/31/2021   HCT 26.0 (L) 01/31/2021   MCV 91.9 01/31/2021   PLT 192 01/31/2021   CMP Latest Ref Rng & Units 01/29/2021  Glucose 65 - 99 mg/dL 85  BUN 7 - 25 mg/dL 10  Creatinine 0.50 - 0.96 mg/dL 0.57  Sodium 135 - 146 mmol/L 137  Potassium 3.5 - 5.3 mmol/L 4.0  Chloride 98 - 110 mmol/L 104  CO2 20 - 32 mmol/L 27  Calcium 8.6 - 10.2 mg/dL 8.5(L)  Total Protein 6.1 - 8.1 g/dL 6.9  Total Bilirubin 0.2 - 1.2 mg/dL 0.3  Alkaline Phos 47 - 119 U/L -  AST 10 - 30 U/L 15  ALT 6 - 29 U/L 11   Edinburgh Score: Edinburgh Postnatal Depression Scale Screening Tool 01/31/2021  I have been able to laugh and see the funny side of things. 0  I have looked forward with enjoyment to things. 0  I have blamed myself unnecessarily when things went wrong. 0  I have been anxious or worried for no good reason. 1  I have felt scared or panicky for no good reason. 1  Things have been getting on top of me. 1  I have been so unhappy that I have had difficulty sleeping. 0  I have felt sad or miserable. 1  I have been  so unhappy that I have been crying. 1  The thought of harming myself has occurred to me. 0  Edinburgh Postnatal Depression Scale Total 5      After visit meds:  Allergies as of 02/01/2021       Reactions   Vancomycin Swelling   SWELLING REACTION UNSPECIFIED   Penicillin G Hives   Penicillins Hives   Has patient had a PCN reaction causing immediate rash, facial/tongue/throat swelling, SOB or lightheadedness with hypotension: YES Has patient had a PCN reaction causing severe rash involving mucus membranes or skin necrosis: NO Has patient had a PCN reaction that required hospitalization NO Has patient had a PCN reaction occurring within the last 10 years: NO If all of the above answers are "NO", then may proceed with Cephalosporin use.        Medication List     STOP taking these medications    cyclobenzaprine 10  MG tablet Commonly known as: FLEXERIL   Humira Pen 40 MG/0.4ML Pnkt Generic drug: Adalimumab       TAKE these medications    acetaminophen 325 MG tablet Commonly known as: Tylenol Take 2 tablets (650 mg total) by mouth every 4 (four) hours as needed for mild pain (for pain scale < 4).   furosemide 20 MG tablet Commonly known as: LASIX Take 1 tablet (20 mg total) by mouth daily.   ibuprofen 600 MG tablet Commonly known as: ADVIL Take 1 tablet (600 mg total) by mouth every 6 (six) hours.   NIFEdipine 30 MG 24 hr tablet Commonly known as: ADALAT CC Take 1 tablet (30 mg total) by mouth daily.   prenatal multivitamin Tabs tablet Take 1 tablet by mouth daily at 12 noon.         Discharge home in stable condition Infant Feeding: Breast Infant Disposition:home with mother Discharge instruction: per After Visit Summary and Postpartum booklet. Activity: Advance as tolerated. Pelvic rest for 6 weeks.  Diet: routine diet Anticipated Birth Control: IUD Postpartum Appointment:4 weeks Additional Postpartum F/U:  Outpatient IUD Future Appointments: Future Appointments  Date Time Provider [place]  02/05/2021  9:30 AM [facility] NURSE [facility]  02/05/2021 10:10 AM Elvera Maria, CNM [facility]  02/08/2021  9:00 AM [facility] NURSE [facility]  02/12/2021  8:50 AM Rasch, Artist Pais, NP [facility]  02/12/2021  9:30 AM [place] [facility]  02/15/2021  8:50 AM [facility] NURSE [facility]  02/19/2021  8:50 AM [place], Kathie Dike, CNM [facility]  02/19/2021  9:30 AM [facility] NURSE [facility]  02/22/2021  9:00 AM [place] [facility]   Follow up Visit:     Renard Matter, MD, MPH OB Fellow, [facility]

## 2021-01-31 NOTE — Anesthesia Postprocedure Evaluation (Signed)
Anesthesia Post Note  Patient: Melissa Conway  Procedure(s) Performed: AN AD HOC LABOR EPIDURAL     Patient location during evaluation: Mother Baby Anesthesia Type: Epidural Level of consciousness: awake Pain management: satisfactory to patient Vital Signs Assessment: post-procedure vital signs reviewed and stable Respiratory status: spontaneous breathing Cardiovascular status: stable Anesthetic complications: no   No notable events documented.  Last Vitals:  Vitals:   01/31/21 0400 01/31/21 0500  BP: 123/78 130/68  Pulse: 100 (!) 109  Resp: 18 18  Temp: (!) 38 C 37.4 C  SpO2: 98% 95%    Last Pain:  Vitals:   01/31/21 0500  TempSrc: Oral  PainSc: 0-No pain   Pain Goal:                   [employer]

## 2021-02-01 ENCOUNTER — Other Ambulatory Visit: Payer: 59

## 2021-02-01 ENCOUNTER — Other Ambulatory Visit (HOSPITAL_COMMUNITY): Payer: Self-pay

## 2021-02-01 MED ORDER — FUROSEMIDE 20 MG PO TABS
20.0000 mg | ORAL_TABLET | Freq: Every day | ORAL | 0 refills | Status: DC
  Filled 2021-02-01: qty 4, 4d supply, fill #0

## 2021-02-01 MED ORDER — ACETAMINOPHEN 325 MG PO TABS: 650.0000 mg | ORAL_TABLET | ORAL | Status: DC | PRN

## 2021-02-01 MED ORDER — IBUPROFEN 600 MG PO TABS
600.0000 mg | ORAL_TABLET | Freq: Four times a day (QID) | ORAL | 0 refills | Status: DC
  Filled 2021-02-01: qty 30, 8d supply, fill #0

## 2021-02-01 MED ORDER — NIFEDIPINE ER 30 MG PO TB24
30.0000 mg | ORAL_TABLET | Freq: Every day | ORAL | 0 refills | Status: DC
  Filled 2021-02-01: qty 30, 30d supply, fill #0

## 2021-02-01 MED ORDER — NIFEDIPINE ER OSMOTIC RELEASE 30 MG PO TB24
30.0000 mg | ORAL_TABLET | Freq: Every day | ORAL | Status: DC
  Administered 2021-02-01: 30 mg via ORAL
  Filled 2021-02-01: qty 1

## 2021-02-01 NOTE — Lactation Note (Signed)
This note was copied from a baby's chart. Lactation Consultation Note  Patient Name: Melissa Conway [id number] Date: 02/01/2021 Reason for consult: Follow-up assessment;1st time breastfeeding;Primapara;Early term 37-38.6wks;Nipple pain/trauma Age:23 hours  Baby had his anterior tongue restriction clipped by Ped.   LC assisted with latching after tongue revision.  Baby latched easily and with a wide gape of his mouth.  Mom stated the latch was comfortable and baby noted to be sucking with deeper jaw extensions and pauses.    Mom to keep baby STS this afternoon to encouraged frequent feedings. Mom to ask for help with latching.  Respiratory rate 70 currently.  Discharge on hold to continue to monitor baby's progress.  RN aware.  LATCH Score Latch: Grasps breast easily, tongue down, lips flanged, rhythmical sucking.  Audible Swallowing: Spontaneous and intermittent  Type of Nipple: Everted at rest and after stimulation  Comfort (Breast/Nipple): Soft / non-tender  Hold (Positioning): Assistance needed to correctly position infant at breast and maintain latch.  LATCH Score: 9   Lactation Tools Discussed/Used Tools: Pump Breast pump type: Manual  Interventions Interventions: Breast feeding basics reviewed;Assisted with latch;Skin to skin;Breast massage;Hand express;Breast compression;Adjust position;Support pillows;Position options;Hand pump;Education  Discharge Discharge Education: Outpatient recommendation Pump: Manual;Personal (Medela DEBP)  Consult Status Consult Status: Follow-up Date: 02/02/21 Follow-up type: In-patient    Judee Clara 02/01/2021, 1:24 PM

## 2021-02-01 NOTE — Lactation Note (Signed)
This note was copied from a baby's chart. Lactation Consultation Note  Patient Name: Melissa Conway [id number] Date: 02/01/2021 Reason for consult: Follow-up assessment;1st time breastfeeding;Early term 37-38.6wks;Primapara;Nipple pain/trauma Age:23 hours  LC in to visit with P1 Mom of ET infant on day of probable discharge.  Baby at 3.8% weight loss.  Mom reports that baby is not feeding as long today, some feedings 4-5 minutes.   [place] asked Mom to observe and assess a feeding.   Baby swaddled in crib.  Mom attempting to latch baby in swaddle to nipple.  Mom has large-ish diameter nipples and very compressible areola, but baby latching to  nipple as Mom was pinching her nipple to feed into baby's mouth.  Mom has slightly blistering on nipple tips.  LC unwrapped baby and assisted Mom in cross cradle hold.  Hand expressed a drop of colostrum onto nipple.  Assisted Mom to hold baby's head from ear to ear and wait for a wider gape of mouth.  Baby eventually opened wide and when brought to breast baby latched deeper.    LC noted suck pattern to be without swallows.  Baby dropping his jaw, but no pauses or swallows.  Showed Mom how to compress breast during sucking.  After 7 mins, baby became sleepy and taken off breast.  Baby was down at nipple base at end of feeding.   LC noted baby's respiratory rate while suckling on the breast to be 70-80/min.  Also, LC noted an anterior short lingual frenulum and labial frenulum to gum line.    RN and Dr. Earle Gell notified. Baby brought to CN for assessment.  LATCH Score Latch: Repeated attempts needed to sustain latch, nipple held in mouth throughout feeding, stimulation needed to elicit sucking reflex.  Audible Swallowing: None  Type of Nipple: Everted at rest and after stimulation  Comfort (Breast/Nipple): Filling, red/small blisters or bruises, mild/mod discomfort  Hold (Positioning): Assistance needed to correctly position infant at breast and maintain  latch.  LATCH Score: 5  Interventions Interventions: Breast feeding basics reviewed;Skin to skin;Assisted with latch;Breast massage;Hand express;Adjust position;Breast compression;Support pillows;Position options;Expressed milk   Consult Status Consult Status: Follow-up Date: 02/01/21 Follow-up type: In-patient    Melissa Conway 02/01/2021, 12:44 PM

## 2021-02-01 NOTE — Progress Notes (Signed)
Patient has an order for babyscripts app. Patient states she has a blood pressure cuff, has already been using babyscripts, and has been documenting her blood pressures prenatally. Patient documented her blood pressure into her app and this RN verified that her documentation can be viewed in epic. Earl Gala, Linda Hedges [place]ens

## 2021-02-05 ENCOUNTER — Encounter: Payer: 59 | Admitting: Advanced Practice Midwife

## 2021-02-05 ENCOUNTER — Other Ambulatory Visit: Payer: 59

## 2021-02-08 ENCOUNTER — Other Ambulatory Visit: Payer: 59

## 2021-02-12 ENCOUNTER — Other Ambulatory Visit: Payer: 59

## 2021-02-12 ENCOUNTER — Encounter: Payer: 59 | Admitting: Obstetrics and Gynecology

## 2021-02-13 ENCOUNTER — Telehealth (HOSPITAL_COMMUNITY): Payer: Self-pay | Admitting: *Deleted

## 2021-02-13 NOTE — Telephone Encounter (Signed)
Patient voiced no questions or concerns at this time. EPDS= 1. Patient reported infant had one episode "last night where he acted like he was hungry and then came off my breast and looked like he was in pain." Continued, "He did it again today." Patient stated that she is breastfeeding and bottle-feeding infant. RN suggested patient try to hand massage her breast before latching infant to see if that helps. Instructed patient to call pediatrician if infant begins to skip feedings, if pattern continues, or if she continues to be concerned. Also reminded patient that in order to maintain her milk supply, if infant is not breastfeeding, then she would need to pump. RN offered to email Lactation Consultant message line number and the phone number for outpatient appointments. Patient agreed.  Patient voiced no other questions or concerns regarding infant at this time. Patient reports infant sleeps in a bassinet on his back. RN reviewed ABCs of safe sleep. Patient verbalized understanding. Patient requested RN email information on hospital's virtual postpartum classes and support groups. Email sent. Erline Levine, RN, 02/13/21, [phone]

## 2021-02-15 ENCOUNTER — Other Ambulatory Visit: Payer: 59

## 2021-02-19 ENCOUNTER — Other Ambulatory Visit: Payer: 59

## 2021-02-19 ENCOUNTER — Encounter: Payer: 59 | Admitting: Advanced Practice Midwife

## 2021-02-22 ENCOUNTER — Other Ambulatory Visit: Payer: 59

## 2021-03-13 NOTE — Progress Notes (Signed)
? ? ?Post Partum Visit Note ? ?Melissa Conway is a 21 y.o. G51P1001 female who presents for a postpartum visit. She is 6 weeks postpartum following a normal spontaneous vaginal delivery.  I have fully reviewed the prenatal and intrapartum course. The delivery was at 37.3 gestational weeks.  Anesthesia: epidural. Postpartum course has been unremarkable. Baby is doing well. Baby is feeding by breast. Bleeding no bleeding. Bowel function is normal. Bladder function is normal. Patient is not sexually active. Contraception method is IUD. Postpartum depression screening: negative. ? ? ?The pregnancy intention screening data noted above was reviewed. Potential methods of contraception were discussed. The patient elected to proceed with No data recorded. ? ? Edinburgh Postnatal Depression Scale - 03/14/21 1331   ? ?  ? Edinburgh Postnatal Depression Scale:  In the Past 7 Days  ? I have been able to laugh and see the funny side of things. 0   ? I have looked forward with enjoyment to things. 0   ? I have blamed myself unnecessarily when things went wrong. 0   ? I have been anxious or worried for no good reason. 0   ? I have felt scared or panicky for no good reason. 0   ? Things have been getting on top of me. 1   ? I have been so unhappy that I have had difficulty sleeping. 0   ? I have felt sad or miserable. 0   ? I have been so unhappy that I have been crying. 0   ? The thought of harming myself has occurred to me. 0   ? Edinburgh Postnatal Depression Scale Total 1   ? ?  ?  ? ?  ? ? ?Health Maintenance Due  ?Topic Date Due  ? HPV VACCINES (1 - 2-dose series) Never done  ? COVID-19 Vaccine (3 - Booster for Pfizer series) 07/19/2019  ? ? ?The following portions of the patient's history were reviewed and updated as appropriate: allergies, current medications, past family history, past medical history, past social history, past surgical history, and problem list. ? ?Review of Systems ?Pertinent items are noted in  HPI. ? ?Objective:  ?BP 127/73   Pulse 86   Ht 5\' 6"  (1.676 m)   Wt 243 lb (110.2 kg)   Breastfeeding Yes   BMI 39.22 kg/m?   ? ?General:  alert, cooperative, and no distress  ? Breasts:  not indicated  ?Lungs: clear to auscultation bilaterally  ?Heart:  regular rate and rhythm  ?Abdomen: soft, non-tender; bowel sounds normal; no masses,  no organomegaly   ?Wound N/a  ?GU exam:  normal  ?     ? ? ? ?GYNECOLOGY OFFICE PROCEDURE NOTE ? ?IUD Insertion Procedure Note ?Procedure: IUD insertion with Mirena ?UPT: Negative ?GC/CT testing: Up to date ? ?Patient identified.  Risks, benefits and alternatives of procedure were discussed including irregular bleeding, cramping, infection, malpositioning or misplacement of the IUD outside the uterus which may require further procedure such as laparoscopy. Also discussed >99% contraception efficacy, increased risk of ectopic pregnancy with failure of method.   Emphasized that this did not protect against STIs, condoms recommended during all sexual encounters. Consent signed. Time out performed.  ? ?Speculum inserted and cervix visualized, prepped with 3 swabs of betadine.  ? ?Grasped with a single tooth tenaculum. Uterus sounded to 8 cm. IUD then inserted without difficulty per manufacturer's instructions and strings cut to 3 cm below cervical os and all instruments removed. Pt tolerated well with  minimal pain and bleeding.  ? ?Discussed concerning signs/symptoms and to call if heavy bleeding, severe abdominal pain, or fever in the following 3 weeks. Manufacturer pamphlet/patient information given. Reviewed timing of efficacy for contraception and to use an alternative form of birth control until that time. ? ?Assessment:  ? ? 1. History of gestational hypertension ?- Resolved ?- Reviewed risk of CHTN ?- Reviewed risk of recurrence and ldASA with next pregnancy ?- No meds at this time and BP normal ? ? ?6wk postpartum exam.  ? ?Plan:  ? ?Essential components of care per ACOG  recommendations: ? ?1.  Mood and well being: Patient with negative depression screening today. Reviewed local resources for support.  ?- Patient tobacco use? No.   ?- hx of drug use? No.   ? ?2. Infant care and feeding:  ?-Patient currently breastmilk feeding? Yes. Reviewed importance of draining breast regularly to support lactation.  ?-Social determinants of health (SDOH) reviewed in EPIC. No concerns ? ?3. Sexuality, contraception and birth spacing ?- Patient does not want a pregnancy in the next year.  Desired family size is 2-3 children.  ?- Reviewed forms of contraception in tiered fashion. Patient desired IUD today.   ?- Discussed birth spacing of 18 months ? ?4. Sleep and fatigue ?-Encouraged family/partner/community support of 4 hrs of uninterrupted sleep to help with mood and fatigue ? ?5. Physical Recovery  ?- Discussed patients delivery and complications. She describes her labor as mixed. ?- Patient had a Vaginal, no problems at delivery. Patient had a 2nd degree laceration. Perineal healing reviewed. Patient expressed understanding ?- Patient has urinary incontinence? No. ?- Patient is safe to resume physical and sexual activity ? ?6.  Health Maintenance ?- HM due items addressed Yes ?- Last pap smear  ?Diagnosis  ?Date Value Ref Range Status  ?07/23/2020   Final  ? - Negative for intraepithelial lesion or malignancy (NILM)  ? Pap smear not done at today's visit.  ?-Breast Cancer screening indicated? No.  ? ?7. Chronic Disease/Pregnancy Condition follow up: None ? ?- PCP follow up ? ?Radene Gunning, MD ?[facility] [place]  ?

## 2021-03-14 ENCOUNTER — Other Ambulatory Visit: Payer: Self-pay

## 2021-03-14 ENCOUNTER — Encounter: Payer: Self-pay | Admitting: Obstetrics and Gynecology

## 2021-03-14 ENCOUNTER — Ambulatory Visit (INDEPENDENT_AMBULATORY_CARE_PROVIDER_SITE_OTHER): Payer: 59 | Admitting: Obstetrics and Gynecology

## 2021-03-14 VITALS — BP 127/73 | HR 86 | Ht 66.0 in | Wt 243.0 lb

## 2021-03-14 DIAGNOSIS — Z30430 Encounter for insertion of intrauterine contraceptive device: Secondary | ICD-10-CM | POA: Diagnosis not present

## 2021-03-14 DIAGNOSIS — Z8759 Personal history of other complications of pregnancy, childbirth and the puerperium: Secondary | ICD-10-CM | POA: Diagnosis not present

## 2021-03-14 LAB — POCT URINE PREGNANCY: Preg Test, Ur: NEGATIVE

## 2021-03-14 MED ORDER — LEVONORGESTREL 20 MCG/DAY IU IUD
1.0000 | INTRAUTERINE_SYSTEM | Freq: Once | INTRAUTERINE | Status: AC
  Administered 2021-03-14: 1 via INTRAUTERINE

## 2021-03-22 ENCOUNTER — Telehealth: Payer: Self-pay

## 2021-03-22 ENCOUNTER — Other Ambulatory Visit: Payer: Self-pay

## 2021-03-22 DIAGNOSIS — N751 Abscess of Bartholin's gland: Secondary | ICD-10-CM | POA: Insufficient documentation

## 2021-03-22 NOTE — ED Triage Notes (Signed)
Pt is 7 weeks postpartum and has a bartholin cyst pt stated that it has been getting bigger and more uncomfortable since yesterday. ?

## 2021-03-22 NOTE — Telephone Encounter (Signed)
Pt called stating she has vaginal bump that has gotten bigger and painful. Pt wants to know if she needs an appt with [place] or with her PCP. We have no available appointments today so pt was told to call PCP. Pt expressed understanding. ?

## 2021-03-23 ENCOUNTER — Emergency Department (HOSPITAL_BASED_OUTPATIENT_CLINIC_OR_DEPARTMENT_OTHER)
Admission: EM | Admit: 2021-03-23 | Discharge: 2021-03-23 | Disposition: A | Discharge status: Home / Self Care | Payer: 59 | Attending: Emergency Medicine | Admitting: Emergency Medicine

## 2021-03-23 DIAGNOSIS — N751 Abscess of Bartholin's gland: Secondary | ICD-10-CM | POA: Diagnosis not present

## 2021-03-23 MED ORDER — LIDOCAINE-EPINEPHRINE (PF) 2 %-1:200000 IJ SOLN
10.0000 mL | Freq: Once | INTRAMUSCULAR | Status: AC
  Administered 2021-03-23: 10 mL
  Filled 2021-03-23: qty 20

## 2021-03-23 NOTE — ED Provider Notes (Signed)
? ?[facility] EM[facility]PT  ?Provider Note ? ?CSN: [id number] ?Arrival date & time: 03/22/21 2127 ? ?History ?Chief Complaint  ?Patient presents with  ? Cyst  ?  Bartholin cyst.   ? ? ?Suheyla Ramires is a 21 y.o. female who is approx 7 weeks post-partum from vaginal delivery reports she has noticed a 'lump' in her vaginal area since then she attributed to sutures placed for a vaginal tear during the delivery. She reports in the last few days, the lump has been getting larger and more painful. She had an IUD placed about a week ago and the doctor doing the insertion did not mention anything to her then. Denies fever or drainage. No discharge or bleeding.    ? ? ?Home Medications ?Prior to Admission medications   ?Medication Sig Start Date End Date Taking? Authorizing Provider  ?Adalimumab (HUMIRA PEN) 40 MG/0.4ML PNKT Inject into the skin. ?Patient not taking: Reported on 03/14/2021 03/07/21   [provider]  ?furosemide (LASIX) 20 MG tablet Take 1 tablet (20 mg total) by mouth daily. ?Patient not taking: Reported on 03/14/2021 02/01/21   Warner Mccreedy, MD  ?NIFEdipine (ADALAT CC) 30 MG 24 hr tablet Take 1 tablet (30 mg total) by mouth daily. ?Patient not taking: Reported on 03/14/2021 02/01/21 04/02/21  Warner Mccreedy, MD  ?Prenatal Vit-Fe Fumarate-FA (PRENATAL MULTIVITAMIN) TABS tablet Take 1 tablet by mouth daily at 12 noon. ?Patient not taking: Reported on 03/14/2021    [provider]  ? ? ? ?Allergies    ?Vancomycin, Penicillin g, and Penicillins ? ? ?Review of Systems   ?Review of Systems ?Please see HPI for pertinent positives and negatives ? ?Physical Exam ?BP 132/84   Pulse 99   Temp 98.2 ?F (36.8 ?C)   Resp 19   Ht 5\' 6"  (1.676 m)   Wt 108.9 kg   SpO2 100%   BMI 38.74 kg/m?  ? ?Physical Exam ?Vitals and nursing note reviewed.  ?HENT:  ?   Head: Normocephalic.  ?   Nose: Nose normal.  ?Eyes:  ?   Extraocular Movements: Extraocular movements intact.  ?Pulmonary:  ?   Effort: Pulmonary  effort is normal.  ?Genitourinary: ?   Comments: Chaperon present, there is a 3cm x 3cm tender fluctuant mass in L lower vulva likely a bartholin's cyst, no bleeding ?Musculoskeletal:     ?   General: Normal range of motion.  ?   Cervical back: Neck supple.  ?Skin: ?   Findings: No rash (on exposed skin).  ?Neurological:  ?   Mental Status: She is alert and oriented to person, place, and time.  ?Psychiatric:     ?   Mood and Affect: Mood normal.  ? ? ?ED Results / Procedures / Treatments   ?EKG ?None ? ?Procedures ? .Incision and Drainage ? ?Date/Time: 03/23/2021 2:36 AM ?Performed by: 05/23/2021, MD ?Authorized by: Pollyann Savoy, MD  ? ?Consent:  ?  Consent obtained:  Verbal ?  Consent given by:  Patient ?Location:  ?  Type:  Bartholin cyst ?  Size:  3cm ?Pre-procedure details:  ?  Skin preparation:  Povidone-iodine ?Anesthesia:  ?  Anesthesia method:  Local infiltration ?  Local anesthetic:  Lidocaine 1% WITH epi ?Procedure type:  ?  Complexity:  Simple ?Procedure details:  ?  Incision types:  Single straight ?  Incision depth:  Submucosal ?  Drainage:  Purulent ?  Drainage amount:  Moderate ?  Packing materials:  Word catheter ?Post-procedure details:  ?  Procedure completion:  Tolerated well, no immediate complications ? ?Medications Ordered in the ED ?Medications  ?lidocaine-EPINEPHrine (XYLOCAINE W/EPI) 2 %-1:200000 (PF) injection 10 mL (10 mLs Infiltration Given by Other 03/23/21 0235)  ? ? ?Initial Impression and Plan ? Patient with infected bartholin's cyst drained in the ED and Word catheter placed. Will recommend outpatient Gyn follow up for removal if it does not fall out on its own. Wound care instructions given.  ? ?ED Course  ? ?  ? ? ?MDM Rules/Calculators/A&P ?Medical Decision Making ?Problems Addressed: ?Bartholin's gland abscess: acute illness or injury ? ?Risk ?Prescription drug management. ? ? ? ?Final Clinical Impression(s) / ED Diagnoses ?Final diagnoses:  ?Bartholin's gland abscess   ? ? ?Rx / DC Orders ?ED Discharge Orders   ? ? None  ? ?  ? ?  ?Pollyann Savoy, MD ?03/23/21 [phone] ? ?

## 2021-04-23 ENCOUNTER — Encounter: Payer: Self-pay | Admitting: Obstetrics and Gynecology

## 2021-04-24 ENCOUNTER — Encounter: Payer: Self-pay | Admitting: *Deleted

## 2021-04-24 NOTE — Progress Notes (Unsigned)
FMLA forms filled out and faxed to pts HR.  Originals mailed to pt's home address. ?

## 2022-02-03 ENCOUNTER — Ambulatory Visit: Payer: 59 | Admitting: Obstetrics and Gynecology

## 2022-02-03 ENCOUNTER — Encounter: Payer: Self-pay | Admitting: Obstetrics and Gynecology

## 2022-02-04 MED ORDER — PARAGARD INTRAUTERINE COPPER IU IUD: 1.0000 | INTRAUTERINE_SYSTEM | Freq: Once | INTRAUTERINE | Status: DC

## 2022-02-04 NOTE — Progress Notes (Signed)
Erroneous encounter

## 2022-02-17 ENCOUNTER — Encounter: Payer: Self-pay | Admitting: Obstetrics & Gynecology

## 2022-02-17 ENCOUNTER — Ambulatory Visit (INDEPENDENT_AMBULATORY_CARE_PROVIDER_SITE_OTHER): Payer: 59 | Admitting: Obstetrics & Gynecology

## 2022-02-17 VITALS — BP 141/88 | HR 88 | Resp 16 | Ht 66.0 in | Wt 242.0 lb

## 2022-02-17 DIAGNOSIS — F32A Depression, unspecified: Secondary | ICD-10-CM

## 2022-02-17 DIAGNOSIS — F419 Anxiety disorder, unspecified: Secondary | ICD-10-CM | POA: Diagnosis not present

## 2022-02-17 DIAGNOSIS — Z30433 Encounter for removal and reinsertion of intrauterine contraceptive device: Secondary | ICD-10-CM | POA: Diagnosis not present

## 2022-02-17 MED ORDER — PARAGARD INTRAUTERINE COPPER IU IUD
1.0000 | INTRAUTERINE_SYSTEM | Freq: Once | INTRAUTERINE | Status: AC
  Administered 2022-02-17: 1 via INTRAUTERINE

## 2022-02-21 NOTE — Progress Notes (Signed)
   Subjective:    Patient ID: Melissa Conway, female    DOB: 13-Nov-2000, 22 y.o.   MRN: [id number]  HPI  22 yo female presents to have Mirena IUD removed and Copper T placed.  She is having mood swings and has tried medication adjustment with her mental health physician.  She would like to have the Mirena removed to see if this helps.    Review of Systems  Constitutional: Negative.   Respiratory: Negative.    Cardiovascular: Negative.   Gastrointestinal: Negative.   Genitourinary: Negative.   Psychiatric/Behavioral:  Positive for dysphoric mood.        Objective:   Physical Exam Vitals reviewed.  Constitutional:      General: She is not in acute distress.    Appearance: She is well-developed.  HENT:     Head: Normocephalic and atraumatic.  Eyes:     Conjunctiva/sclera: Conjunctivae normal.  Cardiovascular:     Rate and Rhythm: Normal rate.  Pulmonary:     Effort: Pulmonary effort is normal.  Genitourinary:    General: Normal vulva.     Vagina: No vaginal discharge.  Skin:    General: Skin is warm and dry.  Neurological:     Mental Status: She is alert and oriented to person, place, and time.  Psychiatric:        Mood and Affect: Mood normal.    Vitals:   02/17/22 1541  BP: (!) 141/88  Pulse: 88  Resp: 16  Weight: 242 lb (109.8 kg)  Height: 5\' 6"  (1.676 m)      Assessment & Plan:  22 yo female with mood disorder hoping that removal of Mirena will help.  GYNECOLOGY CLINIC PROCEDURE NOTE  Melissa Conway is a 22 y.o. G1P1001 here for Mirena IUD removal and reinsertion of Copper T. No GYN concerns.    IUD Removal and Reinsertion  Patient identified, informed consent performed. Discussed risks of irregular bleeding, cramping, infection, malpositioning or misplacement of the IUD outside the uterus which may require further procedures. Time out was performed. Speculum placed in the vagina. Cervix visualized. Cleaned with Betadine x 2. Grasped anteriorly with a  single tooth tenaculum. The strings of the IUD were grasped and pulled using ring forceps. The IUD was successfully removed in its entirety. Uterus sounded to 7 cm. Copper T IUD placed per manufacturer's recommendations with no resistance. Strings trimmed to 3 cm. Tenaculum was removed, good hemostasis noted. Patient tolerated procedure well. Patient was given post-procedure instructions.  Patient was also asked to check IUD strings periodically and follow up in 4 weeks for IUD check.

## 2022-03-24 ENCOUNTER — Ambulatory Visit (INDEPENDENT_AMBULATORY_CARE_PROVIDER_SITE_OTHER): Payer: 59 | Admitting: Obstetrics & Gynecology

## 2022-03-24 ENCOUNTER — Encounter: Payer: Self-pay | Admitting: Obstetrics & Gynecology

## 2022-03-24 VITALS — BP 131/81 | HR 97 | Ht 66.0 in | Wt 239.0 lb

## 2022-03-24 DIAGNOSIS — Z30431 Encounter for routine checking of intrauterine contraceptive device: Secondary | ICD-10-CM | POA: Diagnosis not present

## 2022-03-24 NOTE — Progress Notes (Signed)
   Subjective:    Patient ID: Melissa Conway, female    DOB: Jun 09, 2000, 22 y.o.   MRN: [id number]  HPI  Taygen comes today for IUD string check.  She had her Mirena IUD removed and had a copper T placed.  She has not had any pain or bleeding.  She is satisfied with the results.  Review of Systems  Constitutional: Negative.   Respiratory: Negative.    Cardiovascular: Negative.   Gastrointestinal: Negative.   Genitourinary: Negative.        Objective:   Physical Exam Vitals reviewed.  Constitutional:      General: She is not in acute distress.    Appearance: She is well-developed.  HENT:     Head: Normocephalic and atraumatic.  Eyes:     Conjunctiva/sclera: Conjunctivae normal.  Cardiovascular:     Rate and Rhythm: Normal rate.  Pulmonary:     Effort: Pulmonary effort is normal.  Genitourinary:    General: Normal vulva.     Comments: IUD string seen Skin:    General: Skin is warm and dry.  Neurological:     Mental Status: She is alert and oriented to person, place, and time.  Psychiatric:        Mood and Affect: Mood normal.    Vitals:   03/24/22 1308  BP: 131/81  Pulse: 97  Weight: 239 lb (108.4 kg)  Height: 5\' 6"  (1.676 m)       Assessment & Plan:  22 year old female with IUD strings seen and doing well with copper T IUD. Return to the office in 1 year for her yearly exam or earlier as needed.

## 2022-03-24 NOTE — Progress Notes (Signed)
No issues with IUD

## 2022-05-24 IMAGING — US US MFM OB FOLLOW-UP
1 series · 14 of 28 positions shown · non-contrast
Comparison: none

[Series 1: us mfm ob follow-up · 36 acquisitions, 14 frames shown]
[im 2/36]
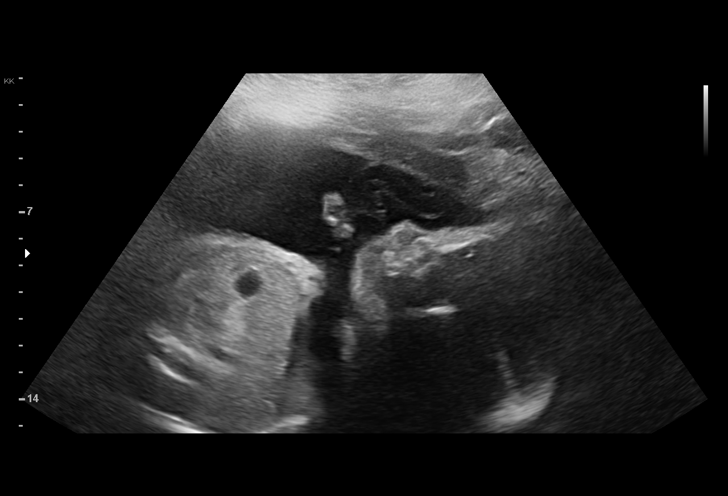
[im 4/36]
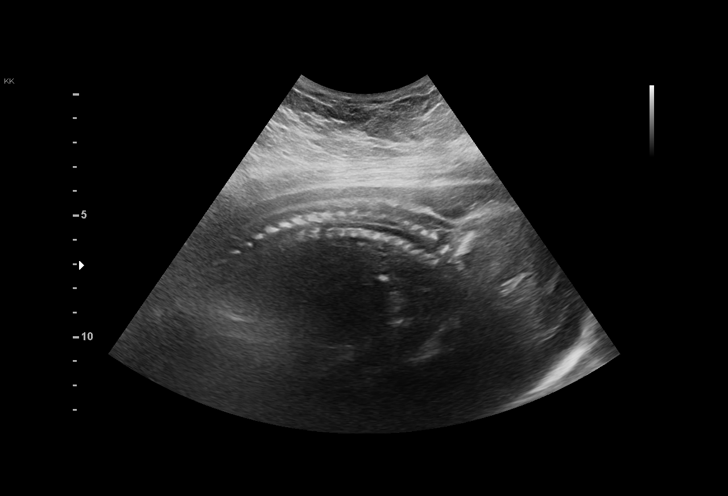
[im 7/36]
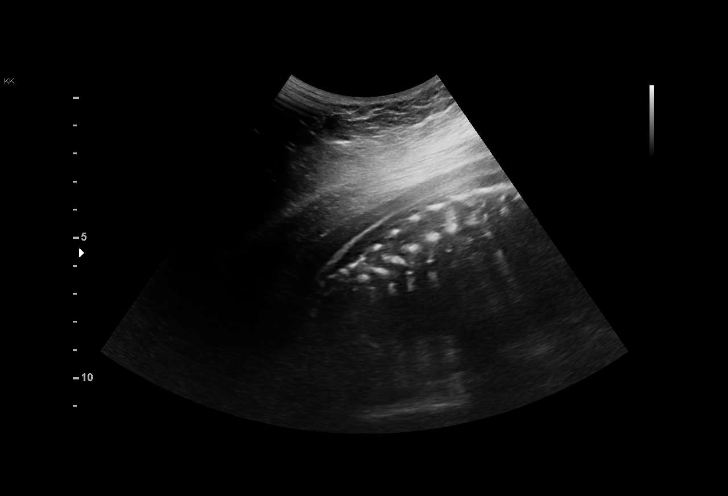
[im 10/36]
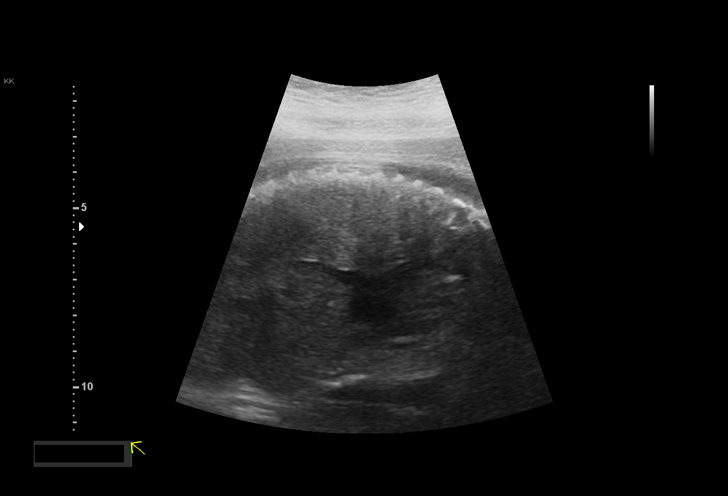
[im 12/36]
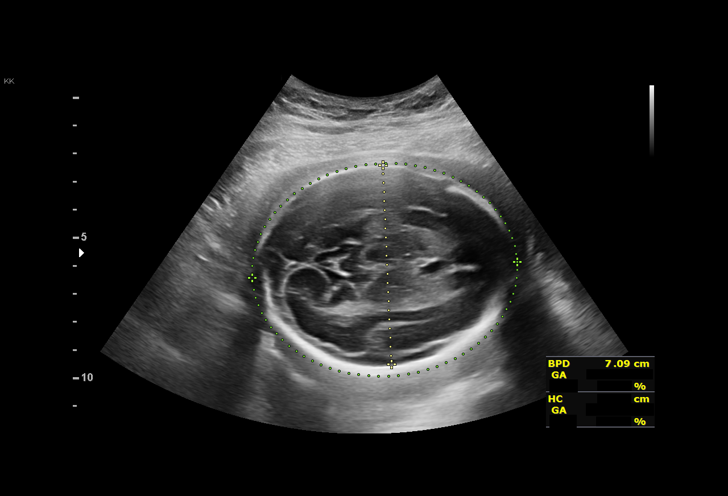
[im 15/36]
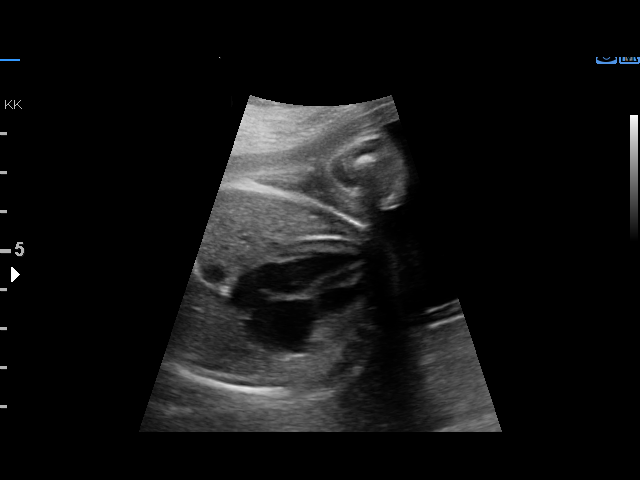
[im 17/36]
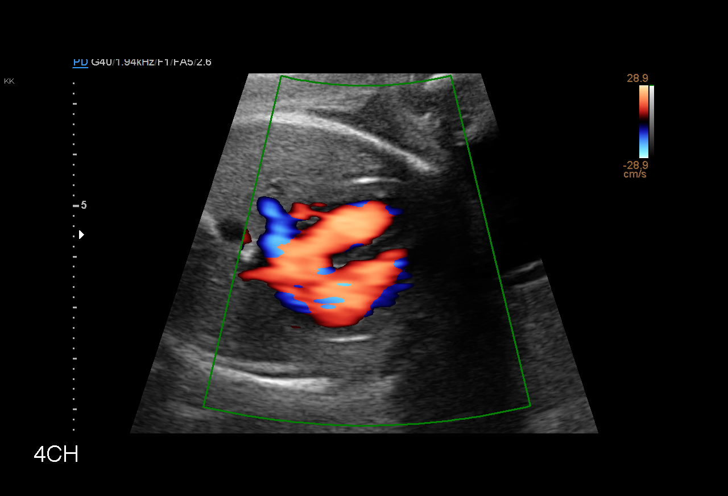
[im 20/36]
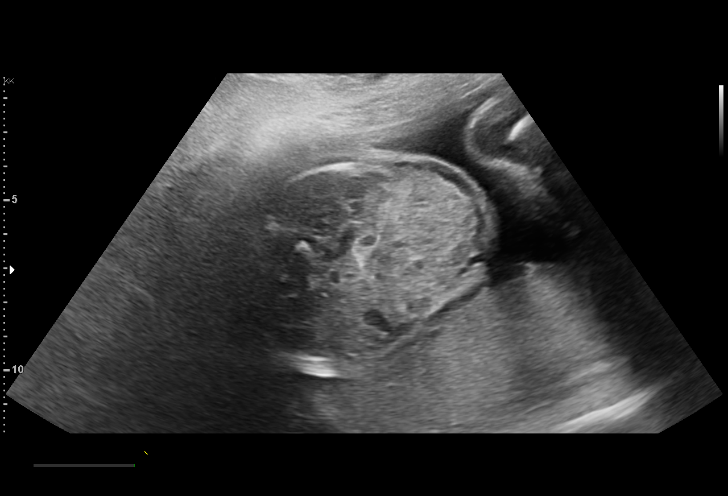
[im 23/36]
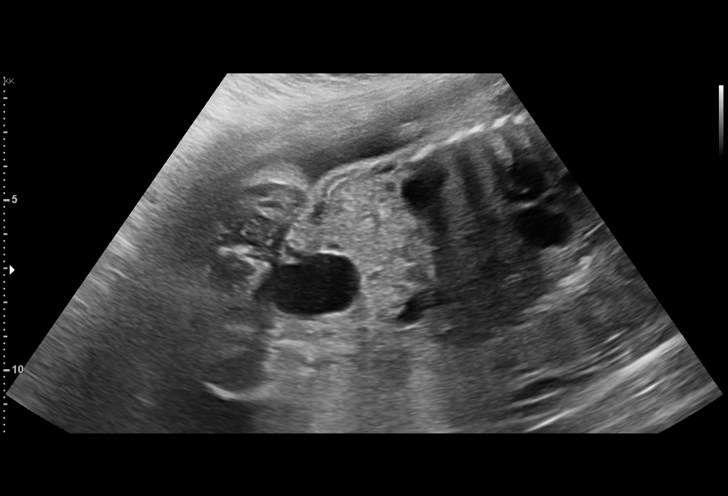
[im 25/36]
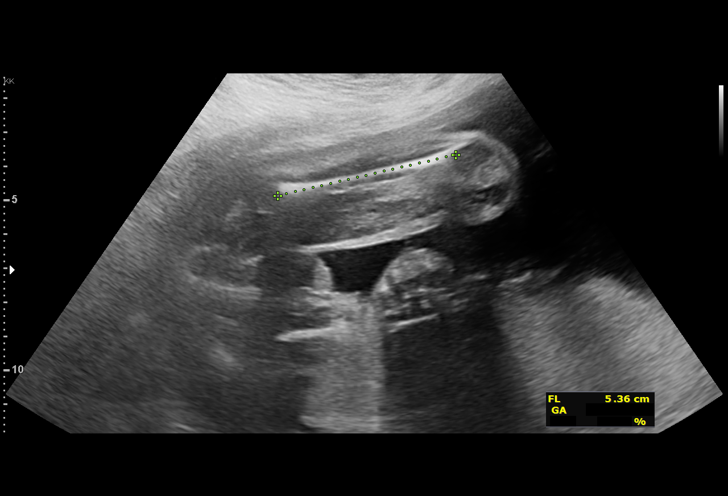
[im 28/36]
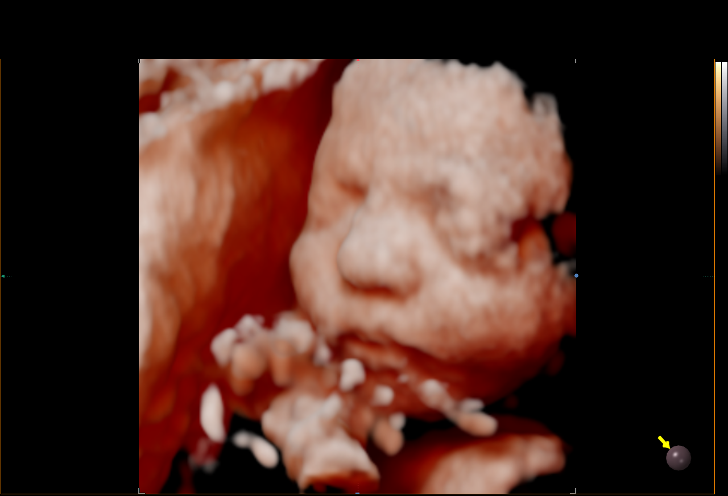
[im 30/36]
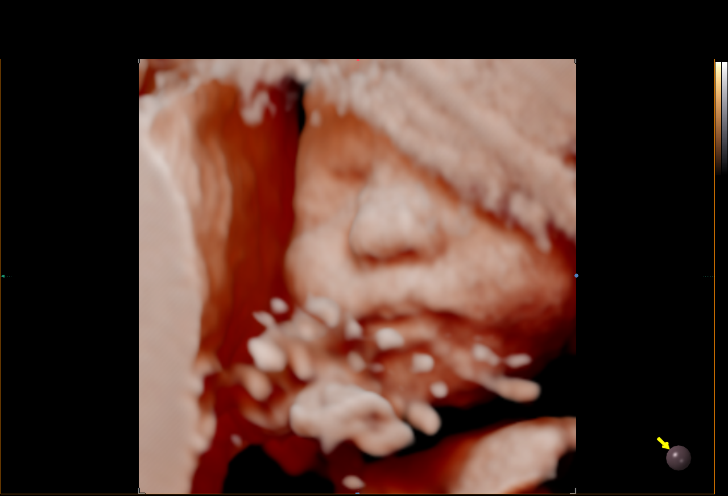
[im 33/36]
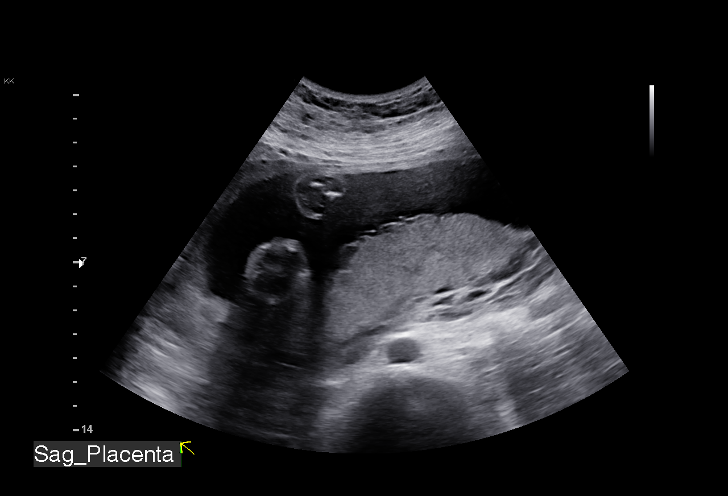
[im 36/36]
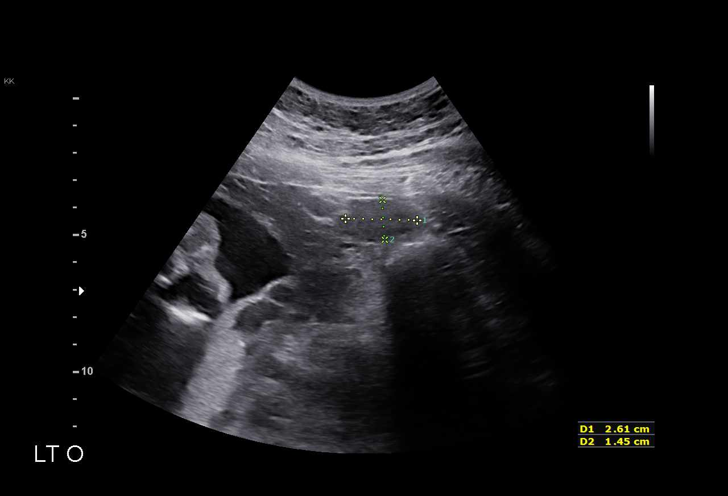

[14 of 28 positions shown; findings below may reference images not displayed]

Indications

 27 weeks gestation of pregnancy
 Maternal Crohn's disease affecting
 pregnancy in second trimester
 Obesity complicating pregnancy, second
 trimester
Fetal Evaluation

 Num Of Fetuses:         1
 Fetal Heart Rate(bpm):  130
 Cardiac Activity:       Observed
 Presentation:           Cephalic
 Placenta:               Posterior
 P. Cord Insertion:      Previously Visualized

 Amniotic Fluid
 AFI FV:      Within normal limits

                             Largest Pocket(cm)

Biometry

 BPD:        71  mm     G. Age:  28w 4d         74  %    CI:         72.6   %    70 - 86
                                                         FL/HC:      20.0   %    18.6 -
 HC:       265   mm     G. Age:  28w 6d         67  %    HC/AC:      1.14        1.05 -
 AC:      231.8  mm     G. Age:  27w 4d         45  %    FL/BPD:     74.8   %    71 - 87
 FL:       53.1  mm     G. Age:  28w 1d         59  %    FL/AC:      22.9   %    20 - 24
 LV:        4.3  mm

 Est. FW:    7727  gm      2 lb 9 oz     59  %
Gestational Age

 LMP:           32w 3d        Date:  04/09/20                 EDD:   01/14/21
 U/S Today:     28w 2d                                        EDD:   02/12/21
 Best:          27w 3d     Det. By:  Early Ultrasound         EDD:   02/18/21
                                     (07/23/20)
Anatomy

 Cranium:               Appears normal         Aortic Arch:            Previously seen
 Cavum:                 Previously seen        Ductal Arch:            Previously seen
 Ventricles:            Appears normal         Diaphragm:              Previously seen
 Choroid Plexus:        Previously seen        Stomach:                Appears normal, left
                                                                       sided
 Cerebellum:            Previously seen        Abdomen:                Previously seen
 Posterior Fossa:       Previously seen        Abdominal Wall:         Previously seen
 Nuchal Fold:           Previously seen        Cord Vessels:           Previously seen
 Face:                  Orbits and profile     Kidneys:                Appear normal
                        previously seen
 Lips:                  Previously seen        Bladder:                Appears normal
 Thoracic:              Previously seen        Spine:                  Appears normal
 Heart:                 Appears normal         Upper Extremities:      Previously seen
                        (4CH, axis, and
                        situs)
 RVOT:                  Previously seen        Lower Extremities:      Previously seen
 LVOT:                  Previously seen

 Other:  Male gender previously seen. VC appears normal, 3VV and 3VTV
         previously visualized.
Cervix Uterus Adnexa

 Cervix
 Not visualized (advanced GA >40wks)

 Right Ovary
 Visualized.

 Left Ovary
 Visualized.
Impression

 Maternal Crohn's disease.  Patient takes Humira every 2
 weeks.  She has not had flares in this pregnancy.

 Fetal growth is appropriate for gestational age .Amniotic fluid
 is normal and good fetal activity is seen .

 We reassured the patient of the findings.
Recommendations

 -An appointment was made for her to return in 5 weeks for
 fetal growth assessment.
                 [place]hung, Pui Ling
